# Patient Record
Sex: Male | Born: 1960 | Race: White | Hispanic: No | Smoking: Never smoker
Health system: Southern US, Community
[De-identification: ages and names within clinical notes are randomized; demographics above are authoritative.]

## PROBLEM LIST (undated history)

## (undated) DIAGNOSIS — M1990 Unspecified osteoarthritis, unspecified site: Secondary | ICD-10-CM

## (undated) DIAGNOSIS — E119 Type 2 diabetes mellitus without complications: Secondary | ICD-10-CM

## (undated) HISTORY — PX: OTHER SURGICAL HISTORY: SHX169

## (undated) HISTORY — PX: ANKLE SURGERY: SHX546

## (undated) HISTORY — PX: TONSILLECTOMY: SUR1361

---
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
## 2013-09-15 ENCOUNTER — Encounter (INDEPENDENT_AMBULATORY_CARE_PROVIDER_SITE_OTHER): Payer: Medicare Other | Admitting: Ophthalmology

## 2013-09-15 DIAGNOSIS — H2510 Age-related nuclear cataract, unspecified eye: Secondary | ICD-10-CM

## 2013-09-15 DIAGNOSIS — H43819 Vitreous degeneration, unspecified eye: Secondary | ICD-10-CM

## 2016-02-04 ENCOUNTER — Encounter (INDEPENDENT_AMBULATORY_CARE_PROVIDER_SITE_OTHER): Payer: Medicare Other | Admitting: Ophthalmology

## 2016-02-04 DIAGNOSIS — T8522XA Displacement of intraocular lens, initial encounter: Secondary | ICD-10-CM | POA: Diagnosis not present

## 2016-02-04 DIAGNOSIS — H338 Other retinal detachments: Secondary | ICD-10-CM

## 2016-02-04 DIAGNOSIS — H43813 Vitreous degeneration, bilateral: Secondary | ICD-10-CM

## 2016-02-04 NOTE — H&P (Signed)
Drew EchevariaDouglas Hofbauer Jr. is an 55 y.o. male.   Chief Complaint:loss of vision for one week right eye HPI: Had cataract surgery right eye two years ago.  Also has dislocated IOL and an open posterior capsule.  Now loss of vision right eye  No past medical history on file.  No past surgical history on file.  No family history on file. Social History:  has no tobacco, alcohol, and drug history on file.  Allergies: No Known Allergies  No prescriptions prior to admission.    Review of systems otherwise negative  There were no vitals taken for this visit.  Physical exam: Mental status: oriented x3. Eyes: See eye exam associated with this date of surgery in media tab.  Scanned in by scanning center Ears, Nose, Throat: within normal limits Neck: Within Normal limits General: within normal limits Chest: Within normal limits Breast: deferred Heart: Within normal limits Abdomen: Within normal limits GU: deferred Extremities: within normal limits Skin: within normal limits  Assessment/Plan Rhegmatogenous retinal detachment right eye [facility], gas injection, possible removal of IOL, laser.right eye  Sherrie GeorgeMATTHEWS, Drew D 02/04/2016, 3:16 PM

## 2016-02-07 NOTE — Progress Notes (Signed)
Patient stated that he had been giving arrival time and medication instructions by Dr. Ashley RoyaltyMatthews. Verified that patient knew where to come to. Patient verbalized understanding will review history tomorrow.

## 2016-02-08 ENCOUNTER — Ambulatory Visit (HOSPITAL_COMMUNITY)
Admission: RE | Admit: 2016-02-08 | Discharge: 2016-02-09 | Disposition: A | Discharge status: Home / Self Care | Payer: Medicare Other | Source: Ambulatory Visit | Attending: Ophthalmology | Admitting: Ophthalmology

## 2016-02-08 ENCOUNTER — Ambulatory Visit (HOSPITAL_COMMUNITY): Payer: Medicare Other | Admitting: Anesthesiology

## 2016-02-08 ENCOUNTER — Encounter (INDEPENDENT_AMBULATORY_CARE_PROVIDER_SITE_OTHER): Payer: Self-pay | Admitting: Ophthalmology

## 2016-02-08 ENCOUNTER — Encounter (HOSPITAL_COMMUNITY): Payer: Self-pay

## 2016-02-08 ENCOUNTER — Encounter (HOSPITAL_COMMUNITY)
Admission: RE | Disposition: A | Discharge status: Home / Self Care | Payer: Self-pay | Source: Ambulatory Visit | Attending: Ophthalmology

## 2016-02-08 DIAGNOSIS — E1110 Type 2 diabetes mellitus with ketoacidosis without coma: Secondary | ICD-10-CM

## 2016-02-08 DIAGNOSIS — M1990 Unspecified osteoarthritis, unspecified site: Secondary | ICD-10-CM | POA: Insufficient documentation

## 2016-02-08 DIAGNOSIS — Z6831 Body mass index (BMI) 31.0-31.9, adult: Secondary | ICD-10-CM | POA: Diagnosis not present

## 2016-02-08 DIAGNOSIS — E119 Type 2 diabetes mellitus without complications: Secondary | ICD-10-CM | POA: Diagnosis not present

## 2016-02-08 DIAGNOSIS — H33001 Unspecified retinal detachment with retinal break, right eye: Secondary | ICD-10-CM | POA: Diagnosis not present

## 2016-02-08 DIAGNOSIS — H04129 Dry eye syndrome of unspecified lacrimal gland: Secondary | ICD-10-CM | POA: Insufficient documentation

## 2016-02-08 DIAGNOSIS — Z7984 Long term (current) use of oral hypoglycemic drugs: Secondary | ICD-10-CM | POA: Insufficient documentation

## 2016-02-08 DIAGNOSIS — H338 Other retinal detachments: Secondary | ICD-10-CM | POA: Diagnosis not present

## 2016-02-08 DIAGNOSIS — H33011 Retinal detachment with single break, right eye: Secondary | ICD-10-CM | POA: Insufficient documentation

## 2016-02-08 DIAGNOSIS — I10 Essential (primary) hypertension: Secondary | ICD-10-CM | POA: Insufficient documentation

## 2016-02-08 DIAGNOSIS — E669 Obesity, unspecified: Secondary | ICD-10-CM

## 2016-02-08 HISTORY — PX: SCLERAL BUCKLE: SHX5340

## 2016-02-08 HISTORY — DX: Unspecified osteoarthritis, unspecified site: M19.90

## 2016-02-08 HISTORY — PX: LASER PHOTO ABLATION: SHX5942

## 2016-02-08 LAB — CBC
HEMATOCRIT: 46.4 % (ref 39.0–52.0)
Hemoglobin: 16.7 g/dL (ref 13.0–17.0)
MCH: 29.2 pg (ref 26.0–34.0)
MCHC: 36 g/dL (ref 30.0–36.0)
MCV: 81.3 fL (ref 78.0–100.0)
PLATELETS: 214 10*3/uL (ref 150–400)
RBC: 5.71 MIL/uL (ref 4.22–5.81)
RDW: 12.6 % (ref 11.5–15.5)
WBC: 8.6 10*3/uL (ref 4.0–10.5)

## 2016-02-08 LAB — BASIC METABOLIC PANEL
Anion gap: 10 (ref 5–15)
BUN: 9 mg/dL (ref 6–20)
CHLORIDE: 104 mmol/L (ref 101–111)
CO2: 23 mmol/L (ref 22–32)
Calcium: 9.7 mg/dL (ref 8.9–10.3)
Creatinine, Ser: 0.79 mg/dL (ref 0.61–1.24)
GFR calc non Af Amer: >60 mL/min (ref >60)
Glucose, Bld: 302 mg/dL — ABNORMAL HIGH (ref 65–99)
POTASSIUM: 3.9 mmol/L (ref 3.5–5.1)
SODIUM: 137 mmol/L (ref 135–145)

## 2016-02-08 LAB — GLUCOSE, CAPILLARY
GLUCOSE-CAPILLARY: 316 mg/dL — AB (ref 65–99)
GLUCOSE-CAPILLARY: 304 mg/dL — AB (ref 65–99)
Glucose-Capillary: 261 mg/dL — ABNORMAL HIGH (ref 65–99)
Glucose-Capillary: 249 mg/dL — ABNORMAL HIGH (ref 65–99)

## 2016-02-08 SURGERY — SCLERAL BUCKLE
Anesthesia: General | Site: Eye | Laterality: Right

## 2016-02-08 MED ORDER — ACETAMINOPHEN 325 MG PO TABS: 325.0000 mg | ORAL_TABLET | ORAL | Status: DC | PRN

## 2016-02-08 MED ORDER — ATROPINE SULFATE 1 % OP SOLN
OPHTHALMIC | Status: AC
  Filled 2016-02-08: qty 5

## 2016-02-08 MED ORDER — HYPROMELLOSE (GONIOSCOPIC) 2.5 % OP SOLN
OPHTHALMIC | Status: AC
  Filled 2016-02-08: qty 15

## 2016-02-08 MED ORDER — LIDOCAINE HCL (CARDIAC) 20 MG/ML IV SOLN
INTRAVENOUS | Status: DC | PRN
  Administered 2016-02-08: 40 mg via INTRAVENOUS

## 2016-02-08 MED ORDER — DIPHENHYDRAMINE HCL 25 MG PO TABS
25.0000 mg | ORAL_TABLET | Freq: Four times a day (QID) | ORAL | Status: DC | PRN
  Filled 2016-02-08: qty 1

## 2016-02-08 MED ORDER — POLYMYXIN B SULFATE 500000 UNITS IJ SOLR
INTRAMUSCULAR | Status: AC
  Filled 2016-02-08: qty 500000

## 2016-02-08 MED ORDER — SODIUM CHLORIDE 0.45 % IV SOLN
INTRAVENOUS | Status: DC
  Administered 2016-02-08: 21:00:00 via INTRAVENOUS

## 2016-02-08 MED ORDER — BACITRACIN-POLYMYXIN B 500-10000 UNIT/GM OP OINT
TOPICAL_OINTMENT | OPHTHALMIC | Status: AC
  Filled 2016-02-08: qty 3.5

## 2016-02-08 MED ORDER — PROPOFOL 10 MG/ML IV BOLUS
INTRAVENOUS | Status: DC | PRN
  Administered 2016-02-08: 200 mg via INTRAVENOUS

## 2016-02-08 MED ORDER — FENTANYL CITRATE (PF) 100 MCG/2ML IJ SOLN
INTRAMUSCULAR | Status: DC | PRN
  Administered 2016-02-08 (×2): 50 ug via INTRAVENOUS
  Administered 2016-02-08: 100 ug via INTRAVENOUS
  Administered 2016-02-08 (×4): 50 ug via INTRAVENOUS

## 2016-02-08 MED ORDER — BRIMONIDINE TARTRATE 0.15 % OP SOLN
1.0000 [drp] | Freq: Two times a day (BID) | OPHTHALMIC | Status: DC
  Administered 2016-02-08: 1 [drp] via OPHTHALMIC
  Filled 2016-02-08: qty 5

## 2016-02-08 MED ORDER — LIDOCAINE 2% (20 MG/ML) 5 ML SYRINGE
INTRAMUSCULAR | Status: AC
  Filled 2016-02-08: qty 5

## 2016-02-08 MED ORDER — FENTANYL CITRATE (PF) 100 MCG/2ML IJ SOLN
INTRAMUSCULAR | Status: AC
  Filled 2016-02-08: qty 2

## 2016-02-08 MED ORDER — HYDRALAZINE HCL 20 MG/ML IJ SOLN: 10.0000 mg | INTRAMUSCULAR | Status: DC | PRN

## 2016-02-08 MED ORDER — INSULIN ASPART 100 UNIT/ML ~~LOC~~ SOLN
5.0000 [IU] | Freq: Once | SUBCUTANEOUS | Status: AC
  Administered 2016-02-08: 5 [IU] via SUBCUTANEOUS

## 2016-02-08 MED ORDER — SODIUM HYALURONATE 10 MG/ML IO SOLN
INTRAOCULAR | Status: AC
  Filled 2016-02-08: qty 0.85

## 2016-02-08 MED ORDER — LIDOCAINE HCL 2 % IJ SOLN
INTRAMUSCULAR | Status: AC
  Filled 2016-02-08: qty 20

## 2016-02-08 MED ORDER — STERILE WATER FOR INJECTION IJ SOLN
INTRAMUSCULAR | Status: AC
  Filled 2016-02-08: qty 20

## 2016-02-08 MED ORDER — BRIMONIDINE TARTRATE 0.2 % OP SOLN: 1.0000 [drp] | Freq: Two times a day (BID) | OPHTHALMIC | Status: DC

## 2016-02-08 MED ORDER — GATIFLOXACIN 0.5 % OP SOLN
1.0000 [drp] | Freq: Four times a day (QID) | OPHTHALMIC | Status: DC
  Filled 2016-02-08: qty 2.5

## 2016-02-08 MED ORDER — FENTANYL CITRATE (PF) 100 MCG/2ML IJ SOLN
25.0000 ug | INTRAMUSCULAR | Status: DC | PRN
  Administered 2016-02-08 (×2): 25 ug via INTRAVENOUS

## 2016-02-08 MED ORDER — TROPICAMIDE 1 % OP SOLN
1.0000 [drp] | OPHTHALMIC | Status: AC | PRN
  Administered 2016-02-08 (×2): 1 [drp] via OPHTHALMIC
  Filled 2016-02-08: qty 15

## 2016-02-08 MED ORDER — INSULIN ASPART 100 UNIT/ML ~~LOC~~ SOLN
SUBCUTANEOUS | Status: AC
  Administered 2016-02-08: 5 [IU] via SUBCUTANEOUS
  Filled 2016-02-08: qty 1

## 2016-02-08 MED ORDER — MEPERIDINE HCL 25 MG/ML IJ SOLN: 6.2500 mg | INTRAMUSCULAR | Status: DC | PRN

## 2016-02-08 MED ORDER — EPINEPHRINE PF 1 MG/ML IJ SOLN
INTRAMUSCULAR | Status: AC
  Filled 2016-02-08: qty 1

## 2016-02-08 MED ORDER — ROCURONIUM BROMIDE 10 MG/ML (PF) SYRINGE
PREFILLED_SYRINGE | INTRAVENOUS | Status: AC
  Filled 2016-02-08: qty 10

## 2016-02-08 MED ORDER — ESMOLOL HCL 100 MG/10ML IV SOLN
INTRAVENOUS | Status: AC
  Filled 2016-02-08: qty 10

## 2016-02-08 MED ORDER — DEXAMETHASONE SODIUM PHOSPHATE 10 MG/ML IJ SOLN
INTRAMUSCULAR | Status: AC
  Filled 2016-02-08: qty 1

## 2016-02-08 MED ORDER — BACITRACIN-POLYMYXIN B 500-10000 UNIT/GM OP OINT
1.0000 "application " | TOPICAL_OINTMENT | Freq: Three times a day (TID) | OPHTHALMIC | Status: DC
  Filled 2016-02-08: qty 3.5

## 2016-02-08 MED ORDER — PREDNISOLONE ACETATE 1 % OP SUSP
1.0000 [drp] | Freq: Four times a day (QID) | OPHTHALMIC | Status: DC
  Filled 2016-02-08: qty 5

## 2016-02-08 MED ORDER — SODIUM CHLORIDE 0.9 % IJ SOLN
INTRAMUSCULAR | Status: AC
  Filled 2016-02-08: qty 10

## 2016-02-08 MED ORDER — DORZOLAMIDE HCL 2 % OP SOLN
1.0000 [drp] | Freq: Three times a day (TID) | OPHTHALMIC | Status: DC
  Filled 2016-02-08: qty 10

## 2016-02-08 MED ORDER — BSS IO SOLN
INTRAOCULAR | Status: AC
  Filled 2016-02-08: qty 15

## 2016-02-08 MED ORDER — BSS PLUS IO SOLN
INTRAOCULAR | Status: AC
  Filled 2016-02-08: qty 500

## 2016-02-08 MED ORDER — MIDAZOLAM HCL 2 MG/2ML IJ SOLN
INTRAMUSCULAR | Status: AC
  Filled 2016-02-08: qty 2

## 2016-02-08 MED ORDER — TETRACAINE HCL 0.5 % OP SOLN
2.0000 [drp] | Freq: Once | OPHTHALMIC | Status: DC
  Filled 2016-02-08: qty 2

## 2016-02-08 MED ORDER — HYDROCODONE-ACETAMINOPHEN 5-325 MG PO TABS
1.0000 | ORAL_TABLET | ORAL | Status: DC | PRN
  Administered 2016-02-09: 1 via ORAL
  Filled 2016-02-08: qty 1
  Filled 2016-02-08: qty 2

## 2016-02-08 MED ORDER — IBUPROFEN 600 MG PO TABS: 600.0000 mg | ORAL_TABLET | Freq: Four times a day (QID) | ORAL | Status: DC | PRN

## 2016-02-08 MED ORDER — ESMOLOL HCL 100 MG/10ML IV SOLN
INTRAVENOUS | Status: DC | PRN
  Administered 2016-02-08: 30 mg via INTRAVENOUS
  Administered 2016-02-08: 50 mg via INTRAVENOUS

## 2016-02-08 MED ORDER — ROCURONIUM BROMIDE 100 MG/10ML IV SOLN
INTRAVENOUS | Status: DC | PRN
  Administered 2016-02-08: 50 mg via INTRAVENOUS

## 2016-02-08 MED ORDER — BUPIVACAINE HCL (PF) 0.75 % IJ SOLN
INTRAMUSCULAR | Status: DC | PRN
  Administered 2016-02-08: 30 mL

## 2016-02-08 MED ORDER — ONDANSETRON HCL 4 MG/2ML IJ SOLN: 4.0000 mg | Freq: Four times a day (QID) | INTRAMUSCULAR | Status: DC | PRN

## 2016-02-08 MED ORDER — MIDAZOLAM HCL 2 MG/2ML IJ SOLN: 0.5000 mg | Freq: Once | INTRAMUSCULAR | Status: DC | PRN

## 2016-02-08 MED ORDER — SUGAMMADEX SODIUM 200 MG/2ML IV SOLN
INTRAVENOUS | Status: DC | PRN
  Administered 2016-02-08: 200 mg via INTRAVENOUS

## 2016-02-08 MED ORDER — BACITRACIN-POLYMYXIN B 500-10000 UNIT/GM OP OINT
TOPICAL_OINTMENT | OPHTHALMIC | Status: DC | PRN
  Administered 2016-02-08: 1 via OPHTHALMIC

## 2016-02-08 MED ORDER — INSULIN ASPART 100 UNIT/ML ~~LOC~~ SOLN
0.0000 [IU] | Freq: Three times a day (TID) | SUBCUTANEOUS | Status: DC
  Administered 2016-02-09: 3 [IU] via SUBCUTANEOUS

## 2016-02-08 MED ORDER — LATANOPROST 0.005 % OP SOLN
1.0000 [drp] | Freq: Every day | OPHTHALMIC | Status: DC
  Filled 2016-02-08: qty 2.5

## 2016-02-08 MED ORDER — EPHEDRINE 5 MG/ML INJ
INTRAVENOUS | Status: AC
  Filled 2016-02-08: qty 10

## 2016-02-08 MED ORDER — ACETAZOLAMIDE SODIUM 500 MG IJ SOLR
500.0000 mg | Freq: Once | INTRAMUSCULAR | Status: AC
  Administered 2016-02-09: 500 mg via INTRAVENOUS
  Filled 2016-02-08: qty 500

## 2016-02-08 MED ORDER — LISINOPRIL 5 MG PO TABS
5.0000 mg | ORAL_TABLET | Freq: Every day | ORAL | Status: DC
  Administered 2016-02-08 – 2016-02-09 (×2): 5 mg via ORAL
  Filled 2016-02-08 (×2): qty 1

## 2016-02-08 MED ORDER — ONDANSETRON HCL 4 MG/2ML IJ SOLN
INTRAMUSCULAR | Status: AC
  Filled 2016-02-08: qty 10

## 2016-02-08 MED ORDER — CEFTAZIDIME 1 G IJ SOLR
INTRAMUSCULAR | Status: AC
  Filled 2016-02-08: qty 1

## 2016-02-08 MED ORDER — INSULIN ASPART 100 UNIT/ML ~~LOC~~ SOLN
0.0000 [IU] | Freq: Every day | SUBCUTANEOUS | Status: DC
  Administered 2016-02-08: 4 [IU] via SUBCUTANEOUS

## 2016-02-08 MED ORDER — STERILE WATER FOR INJECTION IJ SOLN
INTRAMUSCULAR | Status: DC | PRN
  Administered 2016-02-08: 20 mL

## 2016-02-08 MED ORDER — DEXAMETHASONE SODIUM PHOSPHATE 10 MG/ML IJ SOLN
INTRAMUSCULAR | Status: DC | PRN
  Administered 2016-02-08: 10 mg via INTRAVENOUS

## 2016-02-08 MED ORDER — PHENYLEPHRINE HCL 2.5 % OP SOLN
1.0000 [drp] | OPHTHALMIC | Status: AC | PRN
Start: 2016-02-08 — End: 2016-02-08
  Administered 2016-02-08 (×2): 1 [drp] via OPHTHALMIC
  Filled 2016-02-08: qty 2

## 2016-02-08 MED ORDER — CEFAZOLIN SODIUM-DEXTROSE 2-4 GM/100ML-% IV SOLN
2.0000 g | INTRAVENOUS | Status: AC
  Administered 2016-02-08: 2 g via INTRAVENOUS
  Filled 2016-02-08: qty 100

## 2016-02-08 MED ORDER — BUPIVACAINE HCL (PF) 0.75 % IJ SOLN
INTRAMUSCULAR | Status: AC
  Filled 2016-02-08: qty 10

## 2016-02-08 MED ORDER — ARTIFICIAL TEARS OP OINT
TOPICAL_OINTMENT | Freq: Every day | OPHTHALMIC | Status: DC | PRN
  Filled 2016-02-08: qty 3.5

## 2016-02-08 MED ORDER — ONDANSETRON HCL 4 MG/2ML IJ SOLN
INTRAMUSCULAR | Status: DC | PRN
  Administered 2016-02-08: 4 mg via INTRAVENOUS

## 2016-02-08 MED ORDER — GATIFLOXACIN 0.5 % OP SOLN
1.0000 [drp] | OPHTHALMIC | Status: AC | PRN
Start: 2016-02-08 — End: 2016-02-08
  Administered 2016-02-08 (×2): 1 [drp] via OPHTHALMIC
  Filled 2016-02-08: qty 2.5

## 2016-02-08 MED ORDER — ACETAZOLAMIDE SODIUM 500 MG IJ SOLR
INTRAMUSCULAR | Status: AC
  Filled 2016-02-08: qty 500

## 2016-02-08 MED ORDER — MIDAZOLAM HCL 5 MG/5ML IJ SOLN
INTRAMUSCULAR | Status: DC | PRN
  Administered 2016-02-08: 2 mg via INTRAVENOUS

## 2016-02-08 MED ORDER — CYCLOPENTOLATE HCL 1 % OP SOLN
1.0000 [drp] | OPHTHALMIC | Status: AC | PRN
  Administered 2016-02-08 (×2): 1 [drp] via OPHTHALMIC
  Filled 2016-02-08: qty 2

## 2016-02-08 MED ORDER — SODIUM CHLORIDE 0.9 % IV SOLN
INTRAVENOUS | Status: DC
  Administered 2016-02-08 (×2): via INTRAVENOUS

## 2016-02-08 MED ORDER — METOPROLOL TARTRATE 5 MG/5ML IV SOLN
INTRAVENOUS | Status: DC | PRN
  Administered 2016-02-08: 2 mg via INTRAVENOUS
  Administered 2016-02-08: 1 mg via INTRAVENOUS
  Administered 2016-02-08: 2 mg via INTRAVENOUS

## 2016-02-08 MED ORDER — TEMAZEPAM 15 MG PO CAPS: 15.0000 mg | ORAL_CAPSULE | Freq: Every evening | ORAL | Status: DC | PRN

## 2016-02-08 MED ORDER — MAGNESIUM HYDROXIDE 400 MG/5ML PO SUSP: 15.0000 mL | Freq: Four times a day (QID) | ORAL | Status: DC | PRN

## 2016-02-08 MED ORDER — DIPHENHYDRAMINE HCL 25 MG PO CAPS: 25.0000 mg | ORAL_CAPSULE | Freq: Four times a day (QID) | ORAL | Status: DC | PRN

## 2016-02-08 MED ORDER — BRIMONIDINE TARTRATE 0.15 % OP SOLN: 1.0000 [drp] | Freq: Two times a day (BID) | OPHTHALMIC | Status: DC

## 2016-02-08 MED ORDER — TRIAMCINOLONE ACETONIDE 40 MG/ML IJ SUSP
INTRAMUSCULAR | Status: AC
  Filled 2016-02-08: qty 5

## 2016-02-08 MED ORDER — PROMETHAZINE HCL 25 MG/ML IJ SOLN: 6.2500 mg | INTRAMUSCULAR | Status: DC | PRN

## 2016-02-08 MED ORDER — PROPOFOL 10 MG/ML IV BOLUS
INTRAVENOUS | Status: AC
  Filled 2016-02-08: qty 20

## 2016-02-08 MED ORDER — LIDOCAINE 2% (20 MG/ML) 5 ML SYRINGE
INTRAMUSCULAR | Status: AC
Start: 2016-02-08 — End: 2016-02-08
  Filled 2016-02-08: qty 10

## 2016-02-08 MED ORDER — MORPHINE SULFATE (PF) 2 MG/ML IV SOLN
1.0000 mg | INTRAVENOUS | Status: AC | PRN
  Administered 2016-02-08: 4 mg via INTRAVENOUS
  Administered 2016-02-09: 2 mg via INTRAVENOUS
  Filled 2016-02-08: qty 1
  Filled 2016-02-08: qty 2

## 2016-02-08 SURGICAL SUPPLY — 70 items
APPLICATOR DR MATTHEWS STRL (MISCELLANEOUS) ×18 IMPLANT
BLADE EYE CATARACT 19 1.4 BEAV (BLADE) ×3 IMPLANT
BLADE MVR KNIFE 19G (BLADE) IMPLANT
CANNULA ANT CHAM MAIN (OPHTHALMIC RELATED) IMPLANT
CANNULA DUAL BORE 23G (CANNULA) IMPLANT
CORDS BIPOLAR (ELECTRODE) IMPLANT
COTTONBALL LRG STERILE PKG (GAUZE/BANDAGES/DRESSINGS) ×9 IMPLANT
COVER MAYO STAND STRL (DRAPES) IMPLANT
COVER SURGICAL LIGHT HANDLE (MISCELLANEOUS) ×3 IMPLANT
DRAPE INCISE 51X51 W/FILM STRL (DRAPES) ×3 IMPLANT
DRAPE OPHTHALMIC 77X100 STRL (CUSTOM PROCEDURE TRAY) ×3 IMPLANT
ERASER HMR WETFIELD 23G BP (MISCELLANEOUS) IMPLANT
FILTER BLUE MILLIPORE (MISCELLANEOUS) IMPLANT
FILTER STRAW FLUID ASPIR (MISCELLANEOUS) IMPLANT
FORCEPS GRIESHABER ILM 25G A (INSTRUMENTS) IMPLANT
GAS AUTO FILL CONSTEL (OPHTHALMIC)
GAS AUTO FILL CONSTELLATION (OPHTHALMIC) IMPLANT
GAS OPHTHALMIC (MISCELLANEOUS) ×3 IMPLANT
GLOVE SS BIOGEL STRL SZ 6.5 (GLOVE) ×1 IMPLANT
GLOVE SS BIOGEL STRL SZ 7 (GLOVE) ×1 IMPLANT
GLOVE SUPERSENSE BIOGEL SZ 6.5 (GLOVE) ×2
GLOVE SUPERSENSE BIOGEL SZ 7 (GLOVE) ×2
GLOVE SURG 8.5 LATEX PF (GLOVE) ×3 IMPLANT
GOWN STRL REUS W/ TWL LRG LVL3 (GOWN DISPOSABLE) ×2 IMPLANT
GOWN STRL REUS W/TWL LRG LVL3 (GOWN DISPOSABLE) ×4
HANDLE PNEUMATIC FOR CONSTEL (OPHTHALMIC) IMPLANT
IMPL SILICONE (Ophthalmic Related) ×1 IMPLANT
IMPLANT SILICONE (Ophthalmic Related) ×5 IMPLANT
KIT BASIN OR (CUSTOM PROCEDURE TRAY) ×3 IMPLANT
KIT PERFLUORON PROCEDURE 5ML (MISCELLANEOUS) IMPLANT
KIT ROOM TURNOVER OR (KITS) ×3 IMPLANT
KNIFE CRESCENT 1.75 EDGEAHEAD (BLADE) IMPLANT
KNIFE GRIESHABER SHARP 2.5MM (MISCELLANEOUS) ×15 IMPLANT
MICROPICK 25G (MISCELLANEOUS)
NEEDLE 18GX1X1/2 (RX/OR ONLY) (NEEDLE) ×3 IMPLANT
NEEDLE 25GX 5/8IN NON SAFETY (NEEDLE) IMPLANT
NEEDLE HYPO 30X.5 LL (NEEDLE) IMPLANT
NEEDLE PRECISIONGLIDE 27X1.5 (NEEDLE) IMPLANT
NS IRRIG 1000ML POUR BTL (IV SOLUTION) ×3 IMPLANT
PACK VITRECTOMY CUSTOM (CUSTOM PROCEDURE TRAY) ×3 IMPLANT
PAD ARMBOARD 7.5X6 YLW CONV (MISCELLANEOUS) ×6 IMPLANT
PAK PIK VITRECTOMY CVS 25GA (OPHTHALMIC) IMPLANT
PIC ILLUMINATED 25G (OPHTHALMIC)
PICK MICROPICK 25G (MISCELLANEOUS) IMPLANT
PIK ILLUMINATED 25G (OPHTHALMIC) IMPLANT
PROBE LASER ILLUM FLEX CVD 25G (OPHTHALMIC) IMPLANT
REPL STRA BRUSH NEEDLE (NEEDLE) IMPLANT
RESERVOIR BACK FLUSH (MISCELLANEOUS) IMPLANT
ROLLS DENTAL (MISCELLANEOUS) ×6 IMPLANT
SLEEVE SCLERAL TYPE 270 (Ophthalmic Related) ×3 IMPLANT
SPEAR EYE SURG WECK-CEL (MISCELLANEOUS) ×12 IMPLANT
SPONGE SURGIFOAM ABS GEL 12-7 (HEMOSTASIS) IMPLANT
STOPCOCK 4 WAY LG BORE MALE ST (IV SETS) IMPLANT
SUT CHROMIC 7 0 TG140 8 (SUTURE) ×3 IMPLANT
SUT ETHILON 9 0 TG140 8 (SUTURE) IMPLANT
SUT MERSILENE 4 0 RV 2 (SUTURE) ×9 IMPLANT
SUT SILK 2 0 (SUTURE) ×2
SUT SILK 2-0 18XBRD TIE 12 (SUTURE) ×1 IMPLANT
SUT SILK 4 0 RB 1 (SUTURE) ×3 IMPLANT
SYR 20CC LL (SYRINGE) IMPLANT
SYR 50ML LL SCALE MARK (SYRINGE) IMPLANT
SYR 5ML LL (SYRINGE) IMPLANT
SYR BULB 3OZ (MISCELLANEOUS) ×3 IMPLANT
SYR TB 1ML LUER SLIP (SYRINGE) IMPLANT
SYRINGE 10CC LL (SYRINGE) IMPLANT
TIRE RTNL 2.5XGRV CNCV 9X (Ophthalmic Related) ×1 IMPLANT
TOWEL OR 17X24 6PK STRL BLUE (TOWEL DISPOSABLE) ×3 IMPLANT
TUBING HIGH PRESS EXTEN 6IN (TUBING) IMPLANT
WATER STERILE IRR 1000ML POUR (IV SOLUTION) ×3 IMPLANT
WIPE INSTRUMENT VISIWIPE 73X73 (MISCELLANEOUS) ×3 IMPLANT

## 2016-02-08 NOTE — Transfer of Care (Signed)
Immediate Anesthesia Transfer of Care Note  Patient: Drew EchevariaDouglas Brazzle Jr.  Procedure(s) Performed: Procedure(s): SCLERAL BUCKLE, C3F8 INJECTION (Right) LASER PHOTO ABLATION (Right)  Patient Location: PACU  Anesthesia Type:General  Level of Consciousness: lethargic and responds to stimulation  Airway & Oxygen Therapy: Patient Spontanous Breathing and Patient connected to nasal cannula oxygen  Post-op Assessment: Report given to RN and Post -op Vital signs reviewed and stable  Post vital signs: Reviewed and stable  Last Vitals:  Vitals:   02/08/16 1121 02/08/16 1200  BP:  131/86  Pulse: 79 89  Resp:  20  Temp: 36.8 C 37 C    Last Pain:  Vitals:   02/08/16 1200  TempSrc: Oral      Patients Stated Pain Goal: 0 (02/08/16 1136)  Complications: No apparent anesthesia complications

## 2016-02-08 NOTE — Anesthesia Preprocedure Evaluation (Addendum)
Anesthesia Evaluation  Patient identified by MRN, date of birth, ID band Patient awake    Reviewed: Allergy & Precautions, NPO status , Patient's Chart, lab work & pertinent test results  History of Anesthesia Complications Negative for: history of anesthetic complications  Airway Mallampati: II  TM Distance: >3 FB Neck ROM: Full    Dental  (+) Dental Advisory Given   Pulmonary neg pulmonary ROS,    breath sounds clear to auscultation       Cardiovascular negative cardio ROS   Rhythm:Regular Rate:Normal     Neuro/Psych negative neurological ROS     GI/Hepatic negative GI ROS,   Endo/Other  Morbid obesityGlucose 302 today: no h/o DM  Renal/GU negative Renal ROS     Musculoskeletal  (+) Arthritis ,   Abdominal (+) + obese,   Peds  Hematology   Anesthesia Other Findings   Reproductive/Obstetrics                            Anesthesia Physical Anesthesia Plan  ASA: III  Anesthesia Plan: General   Post-op Pain Management:    Induction: Intravenous  Airway Management Planned: Oral ETT  Additional Equipment:   Intra-op Plan:   Post-operative Plan: Extubation in OR  Informed Consent: I have reviewed the patients History and Physical, chart, labs and discussed the procedure including the risks, benefits and alternatives for the proposed anesthesia with the patient or authorized representative who has indicated his/her understanding and acceptance.   Dental advisory given  Plan Discussed with: CRNA and Surgeon  Anesthesia Plan Comments: (Plan routine monitors, GETA Dr. Ashley RoyaltyMatthews to consult hospitalist post op for eval of newly elevated blood sugar, given urgent nature of eye requiring surgical intervention, will proceed with surgery)        Anesthesia Quick Evaluation

## 2016-02-08 NOTE — Anesthesia Postprocedure Evaluation (Signed)
Anesthesia Post Note  Patient: Drew EchevariaDouglas Mars Jr.  Procedure(s) Performed: Procedure(s) (LRB): SCLERAL BUCKLE, C3F8 INJECTION (Right) LASER PHOTO ABLATION (Right)  Patient location during evaluation: PACU Anesthesia Type: General Level of consciousness: awake, awake and alert and oriented Pain management: pain level controlled Vital Signs Assessment: post-procedure vital signs reviewed and stable Respiratory status: spontaneous breathing, nonlabored ventilation, respiratory function stable and patient connected to nasal cannula oxygen Cardiovascular status: blood pressure returned to baseline Anesthetic complications: no    Last Vitals:  Vitals:   02/08/16 1200 02/08/16 1802  BP: 131/86   Pulse: 89   Resp: 20   Temp: 37 C 36.5 C    Last Pain:  Vitals:   02/08/16 1802  TempSrc:   PainSc: 0-No pain                 Greenleigh Kauth COKER

## 2016-02-08 NOTE — Progress Notes (Signed)
Anesthesiology Note:  55 year old male with a right retinal detachment who underwent R. scleral buckle and gas injection today under general anesthesia. Pre-op labs were notable for glucose 302. He denies any prior history of diabetes. Surgery and anesthesia were uneventful. Now in PACU.  VS; T-36.5 BP- 161 RR- 18 HR- 110 (SR) O2 Sat 95% on RA  glucose 249.    Impression: Newly diagnosed DM in 55 year old male who underwent R. Scleral buckle today for retinal detachment.  Plan: Hospitalist consult for DM mangement and assistance with follow-up. Admit overnight to 6N for post-op care.  Kipp Broodavid Isael Evans
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
## 2016-02-08 NOTE — Anesthesia Procedure Notes (Signed)
Procedure Name: Intubation Date/Time: 02/08/2016 3:26 PM Performed by: Faustino CongressWHITE, Durell Lofaso TENA Cambrea Kirt Pre-anesthesia Checklist: Patient identified, Emergency Drugs available, Suction available and Patient being monitored Patient Re-evaluated:Patient Re-evaluated prior to inductionOxygen Delivery Method: Circle System Utilized Preoxygenation: Pre-oxygenation with 100% oxygen Intubation Type: IV induction Ventilation: Mask ventilation without difficulty Laryngoscope Size: Mac and 4 Grade View: Grade III Tube type: Oral Tube size: 7.5 mm Number of attempts: 1 Airway Equipment and Method: Stylet and Oral airway Placement Confirmation: ETT inserted through vocal cords under direct vision,  positive ETCO2 and breath sounds checked- equal and bilateral Secured at: 23 cm Tube secured with: Tape Dental Injury: Teeth and Oropharynx as per pre-operative assessment

## 2016-02-08 NOTE — Brief Op Note (Signed)
Brief Operative note   Preoperative diagnosis:  retinal detachment right eye Postoperative diagnosis  * No Diagnosis Codes entered *  Procedures: Scleral buckle, laser, gas injection right eye  Surgeon:  Sherrie GeorgeJohn D Erica Richwine, MD...  Assistant:  Rosalie DoctorLisa Johnson SA  And Salena SanerMindy Techner    Anesthesia: General  Specimen: none  Estimated blood loss:  1cc  Complications: none  Patient sent to PACU in good condition  Composed by Sherrie GeorgeJohn D Theodus Ran MD  Dictation number: [id number]

## 2016-02-09 ENCOUNTER — Encounter (HOSPITAL_COMMUNITY): Payer: Self-pay | Admitting: Ophthalmology

## 2016-02-09 DIAGNOSIS — H33001 Unspecified retinal detachment with retinal break, right eye: Secondary | ICD-10-CM | POA: Diagnosis not present

## 2016-02-09 DIAGNOSIS — H33011 Retinal detachment with single break, right eye: Secondary | ICD-10-CM | POA: Diagnosis not present

## 2016-02-09 DIAGNOSIS — E119 Type 2 diabetes mellitus without complications: Secondary | ICD-10-CM | POA: Diagnosis not present

## 2016-02-09 DIAGNOSIS — I10 Essential (primary) hypertension: Secondary | ICD-10-CM

## 2016-02-09 LAB — LIPID PANEL
CHOL/HDL RATIO: 6.4 ratio
Cholesterol: 244 mg/dL — ABNORMAL HIGH (ref 0–200)
HDL: 38 mg/dL — AB (ref >40)
LDL CALC: 191 mg/dL — AB (ref 0–99)
Triglycerides: 77 mg/dL (ref <150)
VLDL: 15 mg/dL (ref 0–40)

## 2016-02-09 LAB — GLUCOSE, CAPILLARY: Glucose-Capillary: 248 mg/dL — ABNORMAL HIGH (ref 65–99)

## 2016-02-09 MED ORDER — ARTIFICIAL TEARS OP OINT: TOPICAL_OINTMENT | Freq: Every day | OPHTHALMIC | 0 refills | Status: AC | PRN

## 2016-02-09 MED ORDER — BACITRACIN-POLYMYXIN B 500-10000 UNIT/GM OP OINT: 1.0000 "application " | TOPICAL_OINTMENT | Freq: Three times a day (TID) | OPHTHALMIC | 0 refills | Status: DC

## 2016-02-09 MED ORDER — HYDROCODONE-ACETAMINOPHEN 5-325 MG PO TABS: 1.0000 | ORAL_TABLET | ORAL | 0 refills | Status: DC | PRN

## 2016-02-09 MED ORDER — GATIFLOXACIN 0.5 % OP SOLN
1.0000 [drp] | Freq: Four times a day (QID) | OPHTHALMIC | Status: DC
Start: 2016-02-09 — End: 2018-12-11

## 2016-02-09 MED ORDER — LIVING WELL WITH DIABETES BOOK
Freq: Once | Status: AC
  Administered 2016-02-09: 11:00:00
  Filled 2016-02-09: qty 1

## 2016-02-09 MED ORDER — PREDNISOLONE ACETATE 1 % OP SUSP: 1.0000 [drp] | Freq: Four times a day (QID) | OPHTHALMIC | 0 refills | Status: DC

## 2016-02-09 MED ORDER — METFORMIN HCL 500 MG PO TABS: 500.0000 mg | ORAL_TABLET | Freq: Every day | ORAL | 0 refills | Status: DC

## 2016-02-09 NOTE — Progress Notes (Signed)
02/09/2016, 6:31 AM  Mental Status:  Awake, Alert, Oriented  Anterior segment: Cornea  Clear    Anterior Chamber Clear    Lens:   , IOL  Intra Ocular Pressure 15 mmHg with Tonopen  Vitreous: Clear 30%gas bubble   Retina:  Attached Good laser reaction   Impression: Excellent result Retina attached   Final Diagnosis: Active Problems:   Macula-off rhegmatogenous retinal detachment, right   Diabetes mellitus, new onset (HCC)   Obesity   Hypertension   Plan: start post operative eye drops.  Discharge to home.  Give post operative instructions  Sherrie GeorgeMATTHEWS, Drew D 02/09/2016, 6:31 AM

## 2016-02-09 NOTE — Plan of Care (Signed)
Problem: Food- and Nutrition-Related Knowledge Deficit (NB-1.1) Goal: Nutrition education Formal process to instruct or train a patient/client in a skill or to impart knowledge to help patients/clients voluntarily manage or modify food choices and eating behavior to maintain or improve health. Outcome: Adequate for Discharge  RD consulted for nutrition education regarding diabetes.   No results found for: HGBA1C   Spoke with pt and family friend at bedside. Pt reports he was making slight changes to his diet due to suspicion that he was developing DM, including eliminating sugary drinks. He has been more mindful about his eating patterns including late night snacking.   RD provided "Carbohydrate Counting for People with Diabetes" handout from the Academy of [employer]. Discussed different food groups and their effects on blood sugar, emphasizing carbohydrate-containing foods. Provided list of carbohydrates and recommended serving sizes of common foods.  Discussed importance of controlled and consistent carbohydrate intake throughout the day. Provided examples of ways to balance meals/snacks and encouraged intake of high-fiber, whole grain complex carbohydrates. Teach back method used.  Expect fair to good compliance.  Body mass index is 31.19 kg/m. Pt meets criteria for obesity, class I based on current BMI.  Current diet order is carb modified, patient is consuming approximately 100% of meals at this time. Labs and medications reviewed. No further nutrition interventions warranted at this time. RD contact information provided. If additional nutrition issues arise, please re-consult RD.  Sherral Dirocco A. Mayford KnifeWilliams, RD, LDN, CDE Pager: [phone] After hours Pager: [phone]

## 2016-02-09 NOTE — Progress Notes (Signed)
Spoke with patient about the new onset of diabetes.  Patient aware of the symptoms of diabetes. Has an aunt with diabetes, his deceased father also had diabetes. States that he does not eat well, but is very knowledgeable of what he needs to be eating.  [facility] He will need to inform them of his hospitalization and will need to be followed for his diabetes control. Reviewed HgbA1C (no result of yet) and checking blood sugars everyday with glucose meter. Dr. Elisabeth Pigeonevine in to see patient.  Will probably be discharged on oral medication. Consult ordered for dietician. Case manager to see patient, as well.  Living Well with Diabetes booklet ordered from the pharmacy. Will continue to monitor blood sugars while in the hospital. Smith MinceKendra Smokey Melott RN BSN CDE

## 2016-02-09 NOTE — Discharge Summary (Signed)
Physician Discharge Summary  Drew Echevariaouglas Diel Jr. ZOX:[id number] DOB: March 05, 1961 DOA: 02/08/2016  PCP: No primary care provider on file.  Admit date: 02/08/2016 Discharge date: 02/09/2016  Recommendations for Outpatient Follow-up:  1. Metformin on discharge 500 mg a day 2. Follow up with ophthalmology per sch appt   Discharge Diagnoses:  Active Problems:   Macula-off rhegmatogenous retinal detachment, right   Diabetes mellitus, new onset (HCC)   Obesity   Hypertension    Discharge Condition: stable   Diet recommendation: as tolerated   History of present illness:   Per consult note 02/08/16 "55 y.o. male who typically enjoys good health and denies any significant medical history, though he does not follow with a PCP, now presenting for surgical repair of right retinal detachment and noted to have marked hyperglycemia on preoperative laboratory evaluation. History is somewhat limited as the patient is somewhat somnolent in the PACU. He reports a family history of non-insulin-dependent diabetes mellitus in his father and an aunt. He is unsure of any prior lipid panels and denies history of hypertension. He has not noted polydipsia or polyuria, has experienced visual disturbances, but these have been attributed to cataract and now retinal detachment. He does not have any known underlying heart disease or kidney disease and serum creatinine is normal on preoperative testing. EKG was obtained preoperatively and features a nonspecific interventricular conduction delay. The patient has remained normotensive. Patient underwent cataract surgery over a year ago and now has suffered a right retinal detachment which was treated this evening with scleral buckle, laser, and gas injection of the right eye by Dr. Ashley RoyaltyMatthews of ophthalmology. Patient reportedly tolerated the procedure without any immediate complication and was transferred to the PACU in good condition. He is interviewed in the PACU where he  remained somewhat somnolent following anesthesia, but is well appearing and with BP 170/85 range, but otherwise normal vitals. He will be observed in the hospital overnight and hospitalist are consulted for further evaluation and management and marked elevation in serum glucose without prior diagnosis of diabetes."  Hospital Course:   New onset diabetes - Will prescribe metformin 500 mg a day - He will follow up with primary care physician and he will follow-up on A1c level - Diabetic coordinator at the bedside this morning - Patient understanding the need for new medication   Signed:  Manson PasseyEVINE, ALMA, MD  [facility] 02/09/2016, 11:05 AM  Pager #: [phone]  Time spent in minutes: less than 30 minutes    Discharge Exam: Vitals:   02/09/16 0204 02/09/16 0604  BP: 126/68 110/62  Pulse: 79 78  Resp: 18 18  Temp: 97.6 F (36.4 C) 97.5 F (36.4 C)   Vitals:   02/08/16 2003 02/08/16 2019 02/09/16 0204 02/09/16 0604  BP: (!) 153/86 (!) 169/86 126/68 110/62  Pulse: 94 97 79 78  Resp: 18  18 18   Temp:  99 F (37.2 C) 97.6 F (36.4 C) 97.5 F (36.4 C)  TempSrc:  Oral    SpO2: 97% 97% 98% 99%  Weight:      Height:        General: Pt is alert, follows commands appropriately, not in acute distress Cardiovascular: Regular rate and rhythm, S1/S2 + Respiratory: Clear to auscultation bilaterally, no wheezing, no crackles, no rhonchi Abdominal: Soft, non tender, non distended, bowel sounds +, no guarding Extremities: no edema, no cyanosis, pulses palpable bilaterally DP and PT Neuro: Grossly nonfocal  Discharge Instructions  Discharge Instructions    Call MD for:  persistant nausea and vomiting    Complete by:  As directed    Call MD for:  redness, tenderness, or signs of infection (pain, swelling, redness, odor or green/yellow discharge around incision site)    Complete by:  As directed    Call MD for:  severe uncontrolled pain    Complete by:  As directed     Diet - low sodium heart healthy    Complete by:  As directed    Diet - low sodium heart healthy    Complete by:  As directed    Discharge instructions    Complete by:  As directed    Post-operative care        Retina and Vitreous surgery  Care of your eye: Please do not rub your eye, you may damage the eye. Wash your hands and use clean tissues each time you blot the eye You may take Extra Strength Tylenol if needed for discomfort.  Call for an appointment if the pain becomes severe and is not improved with the medications given on discharge from [facility]. It is normal for the eye to be red, swollen and bloodshot.  You will have red-tinged tears.  Activities: *Bed rest or moderate activity is recommended for the first week. *Do not engage in extended reading because this movement can loosen the retina. * Do not strain yourself or lift more than ten pounds.   *You May: Watch television, eat in a restaurant and ride in a car  Bathing:   *Do not get your eye wet for 4 days.  You may bathe without getting the eye wet.  You may wash your hair by lying backward or forward at the sink.  Gas bubble: If you have a gas bubble you may not lie on your back.  You must not fly in an airplane as long as the bubble is in your eye. You will see the bubble as a dark circle in the lower part of your vision.  It moves when you tilt your head.  Maintain the following positions:  Left side down or face down.  May sit straight up.  Post Operative Appointment: Please call my office at [phone] for a one-week follow up appointment if one was not given to you before surgery.  Dressing:  Please wear the hard shield only at night for three weeks.  You may include the pad if needed  MEDICATION INSTRUCTIONS Place the following drops in the operative eye: __Artificial tears 4 times daily ____Zymaxid    4 times daily ____Prednisolone 1%   4 times daily ____Polysporin Ophth Ointment 3 times daily __  *Please  wait 5 minutes between eye drops if they are scheduled at the same time *Please continue all eye drops in the non-operative eye *Resume all of your other medications as before your surgery   Discharge instructions    Complete by:  As directed    Metformin on discharge 500 mg a day Follow up with PCP in next 2-3 weeks, recheck A1c   Increase activity slowly    Complete by:  As directed    Increase activity slowly    Complete by:  As directed        Medication List    TAKE these medications   artificial tears Oint ophthalmic ointment Place into the left eye daily as needed (dry eyes).   ARTIFICIAL TEARS OP Apply 1 drop to eye daily as needed (dry eyes).   bacitracin-polymyxin b ophthalmic ointment Commonly known  as:  POLYSPORIN Place 1 application into the right eye 3 (three) times daily. apply to eye every 12 hours while awake   diphenhydrAMINE 25 MG tablet Commonly known as:  BENADRYL Take 25 mg by mouth every 6 (six) hours as needed for allergies.   gatifloxacin 0.5 % Soln Commonly known as:  ZYMAXID Place 1 drop into the right eye 4 (four) times daily.   HYDROcodone-acetaminophen 5-325 MG tablet Commonly known as:  NORCO/VICODIN Take 1-2 tablets by mouth every 4 (four) hours as needed for moderate pain.   ibuprofen 200 MG tablet Commonly known as:  ADVIL,MOTRIN Take 600 mg by mouth every 6 (six) hours as needed for headache or moderate pain.   metFORMIN 500 MG tablet Commonly known as:  GLUCOPHAGE Take 1 tablet (500 mg total) by mouth daily with breakfast.   prednisoLONE acetate 1 % ophthalmic suspension Commonly known as:  PRED FORTE Place 1 drop into the right eye 4 (four) times daily.      Follow-up Information    [facility] Follow [facility].   Contact information: [address] [place] [phone]            The results of significant diagnostics from this hospitalization (including imaging, microbiology, ancillary and  laboratory) are listed below for reference.    Significant Diagnostic Studies: No results found.  Microbiology: No results found for this or any previous visit (from the past 240 hour(s)).   Labs: Basic Metabolic Panel:  Recent Labs Lab 02/08/16 1142  NA 137  K 3.9  CL 104  CO2 23  GLUCOSE 302*  BUN 9  CREATININE 0.79  CALCIUM 9.7   Liver Function Tests: No results for input(s): AST, ALT, ALKPHOS, BILITOT, PROT, ALBUMIN in the last 168 hours. No results for input(s): LIPASE, AMYLASE in the last 168 hours. No results for input(s): AMMONIA in the last 168 hours. CBC:  Recent Labs Lab 02/08/16 1142  WBC 8.6  HGB 16.7  HCT 46.4  MCV 81.3  PLT 214   Cardiac Enzymes: No results for input(s): CKTOTAL, CKMB, CKMBINDEX, TROPONINI in the last 168 hours. BNP: BNP (last 3 results) No results for input(s): BNP in the last 8760 hours.  ProBNP (last 3 results) No results for input(s): PROBNP in the last 8760 hours.  CBG:  Recent Labs Lab 02/08/16 1432 02/08/16 1806 02/08/16 1919 02/08/16 2138 02/09/16 0829  GLUCAP 261* 249* 316* 304* 248*

## 2016-02-09 NOTE — Care Management Note (Signed)
Case Management Note  Patient Details  Name: Drew EchevariaDouglas Hires Jr. MRN: [id number] Date of Birth: 19-Apr-1960  Subjective/Objective:                    Action/Plan:  Spoke with patient and two male visitors in hospital room. Patient states [facility] [address] . He called a few months ago and was assigned a MD ( patient not sure of name) , but never scheduled appointment. Visitor stated she will make him an appointment today. Expected Discharge Date:                  Expected Discharge Plan:  Home/Self Care  In-House Referral:     Discharge planning Services  CM Consult  Post Acute Care Choice:    Choice offered to:  Patient  DME Arranged:    DME Agency:     HH Arranged:    HH Agency:     Status of Service:  Completed, signed off  If discussed at [employer] Length of Stay Meetings, dates discussed:    Additional Comments:  Drew Evans, Drew Kaufman Marie, RN 02/09/2016, 10:07 AM

## 2016-02-09 NOTE — Progress Notes (Signed)
Patient discharged to home with instructions and prescriptions. 

## 2016-02-09 NOTE — Discharge Instructions (Signed)
Metformin extended-release tablets What is this medicine? METFORMIN (met FOR min) is used to treat type 2 diabetes. It helps to control blood sugar. Treatment is combined with diet and exercise. This medicine can be used alone or with other medicines for diabetes. This medicine may be used for other purposes; ask your health care provider or pharmacist if you have questions. COMMON BRAND NAME(S): Fortamet, Glucophage XR, Glumetza What should I tell my health care provider before I take this medicine? They need to know if you have any of these conditions: -anemia -frequently drink alcohol-containing beverages -become easily dehydrated -heart attack -heart failure -kidney disease -liver disease -polycystic ovary syndrome -serious infection or injury -vomiting -an unusual or allergic reaction to metformin, other medicines, foods, dyes, or preservatives -pregnant or trying to get pregnant -breast-feeding How should I use this medicine? Take this medicine by mouth with a glass of water. Take it with meals. Swallow whole, do not crush or chew. Follow the directions on the prescription label. Take your medicine at regular intervals. Do not take your medicine more often than directed. Talk to your pediatrician regarding the use of this medicine in children. Special care may be needed. Overdosage: If you think you have taken too much of this medicine contact a poison control center or emergency room at once. NOTE: This medicine is only for you. Do not share this medicine with others. What if I miss a dose? If you miss a dose, take it as soon as you can. If it is almost time for your next dose, take only that dose. Do not take double or extra doses. What may interact with this medicine? Do not take this medicine with any of the following medications: -dofetilide -gatifloxacin -certain contrast medicines given before X-rays, CT scans, MRI, or other procedures This medicine may also interact with  the following medications: -acetazolamide -certain medicines for HIV infection or hepatitis, like adefovir, emtricitabine, entecavir, lamivudine, or tenofovir -cimetidine -crizotinib -digoxin -diuretics -male hormones, like estrogens or progestins and birth control pills -glycopyrrolate -isoniazid -lamotrigine -medicines for blood pressure, heart disease, irregular heart beat -memantine -midodrine -methazolamide -morphine -nicotinic acid -phenothiazines like chlorpromazine, mesoridazine, prochlorperazine, thioridazine -phenytoin -procainamide -propantheline -quinidine -quinine -ranitidine -ranolazine -steroid medicines like prednisone or cortisone -stimulant medicines for attention disorders, weight loss, or to stay awake -thyroid medicines -topiramate -trimethoprim -trospium -vancomycin -vandetanib -zonisamide This list may not describe all possible interactions. Give your health care provider a list of all the medicines, herbs, non-prescription drugs, or dietary supplements you use. Also tell them if you smoke, drink alcohol, or use illegal drugs. Some items may interact with your medicine. What should I watch for while using this medicine? Visit your doctor or health care professional for regular checks on your progress. A test called the HbA1C (A1C) will be monitored. This is a simple blood test. It measures your blood sugar control over the last 2 to 3 months. You will receive this test every 3 to 6 months. Learn how to check your blood sugar. Learn the symptoms of low and high blood sugar and how to manage them. Always carry a quick-source of sugar with you in case you have symptoms of low blood sugar. Examples include hard sugar candy or glucose tablets. Make sure others know that you can choke if you eat or drink when you develop serious symptoms of low blood sugar, such as seizures or unconsciousness. They must get medical help at once. Tell your doctor or health  care professional if you have  high blood sugar. You might need to change the dose of your medicine. If you are sick or exercising more than usual, you might need to change the dose of your medicine. Do not skip meals. Ask your doctor or health care professional if you should avoid alcohol. Many nonprescription cough and cold products contain sugar or alcohol. These can affect blood sugar. This medicine may cause ovulation in premenopausal women who do not have regular monthly periods. This may increase your chances of becoming pregnant. You should not take this medicine if you become pregnant or think you may be pregnant. Talk with your doctor or health care professional about your birth control options while taking this medicine. Contact your doctor or health care professional right away if think you are pregnant. The tablet shell for some brands of this medicine does not dissolve. This is normal. The tablet shell may appear whole in the stool. This is not a cause for concern. If you are going to need surgery, a MRI, CT scan, or other procedure, tell your doctor that you are taking this medicine. You may need to stop taking this medicine before the procedure. Wear a medical ID bracelet or chain, and carry a card that describes your disease and details of your medicine and dosage times. What side effects may I notice from receiving this medicine? Side effects that you should report to your doctor or health care professional as soon as possible: -allergic reactions like skin rash, itching or hives, swelling of the face, lips, or tongue -breathing problems -feeling faint or lightheaded, falls -muscle aches or pains -signs and symptoms of low blood sugar such as feeling anxious, confusion, dizziness, increased hunger, unusually weak or tired, sweating, shakiness, cold, irritable, headache, blurred vision, fast heartbeat, loss of consciousness -slow or irregular heartbeat -unusual stomach pain or  discomfort -unusually tired or weak Side effects that usually do not require medical attention (report to your doctor or health care professional if they continue or are bothersome): -diarrhea -headache -heartburn -metallic taste in mouth -nausea -stomach gas, upset This list may not describe all possible side effects. Call your doctor for medical advice about side effects. You may report side effects to FDA at 1-800-FDA-1088. Where should I keep my medicine? Keep out of the reach of children. Store at room temperature between 15 and 30 degrees C (59 and 86 degrees F). Protect from light. Throw away any unused medicine after the expiration date. NOTE: This sheet is a summary. It may not cover all possible information. If you have questions about this medicine, talk to your doctor, pharmacist, or health care provider.  2017 Elsevier/Gold Standard (2013-08-05 22:12:16)

## 2016-02-09 NOTE — Op Note (Signed)
NAMKristine Evans:  Barsanti, Bobbie              ACCOUNT NO.:  [id number]  MEDICAL RECORD NO.:  [id number]  LOCATION:  6N32C                        FACILITY:  [facility]  PHYSICIAN:  Beulah GandyJohn D. Ashley RoyaltyMatthews, M.D. DATE OF BIRTH:  May 20, 1960  DATE OF PROCEDURE:  02/08/2016 DATE OF DISCHARGE:                              OPERATIVE REPORT   ADMISSION DIAGNOSIS:  Retinal detachment, right eye.  PROCEDURE:  Scleral buckle, right eye.  Retinal photocoagulation, right eye.  Gas fluid exchange, right eye.  SURGEON:  Beulah GandyJohn D. Ashley RoyaltyMatthews, M.D.  ASSISTANT:  Rosalie DoctorLisa Johnson and [facility] Tischner.  ANESTHESIA:  General.  DETAILS:  After proper endotracheal anesthesia usual prep and drape was performed.  360 degree limbal peritomy, isolation of 4 rectus muscles on 2-0 silk, scleral dissection for 360 degrees to admit #279 intrascleral implant.  Diathermy placed in the bed.  279 implant was trimmed with 1 mm trim from the posterior edge.  The implant was placed around the globe with the joint at 2 o'clock.  A 240 band was placed around the eye with a 270 sleeve at 2 o'clock.  2 sutures per quadrant and 3 sutures in 1 quadrant, 9 sutures were placed in the scleral flaps.  Perforation site chosen at 10 o'clock in the posterior aspect of the bed.  A large amount of clear colorless subretinal fluid came forth in a controlled manner.  When the fluid stopped the scleral sutures were drawn securely and a proper indentation of the globe was obtained.  The buckle was adjusted and trimmed.  The band was adjusted and trimmed.  The sutures were pulled securely knotted and the free ends removed.  The indirect ophthalmoscope showed the retina to be lying nicely in place on the scleral buckle.  Minimal subretinal fluid remaining.  The indirect ophthalmoscope laser was moved into place. 762 burns were placed around the retinal periphery.  The power between 400 and 900 mW, 1000 microns each and, 0.1 seconds each.  C3F8 100% 0.6 mL was injected  through the 12 o'clock pars plana as a gas bubble.  Paracentesis x2 obtained a closing pressure of 10 with a Barraquer tonometer.  The buckle was adjusted and trimmed.  The sutures were trimmed.  The conjunctiva was reposited with 7-0 chromic suture.  Polymyxin and ceftazidime were injected around the globe for postop antibiotic coverage.  Decadron 10 mg was injected into the lower subconjunctival space.  Atropine solution was applied.  Marcaine was injected around the globe for postop pain, Polysporin ophthalmic ointment, patch, and shield were placed.  Closing pressure was 10 with a Barraquer tonometer.  COMPLICATIONS:  None.  DURATION:  2 hours and 30 minutes.  The patient was awakened and taken to recovery in satisfactory condition.     Beulah GandyJohn D. Ashley RoyaltyMatthews, M.D.     JDM/MEDQ  D:  02/08/2016  T:  02/09/2016  Job:  [id number]

## 2016-02-10 LAB — HEMOGLOBIN A1C
HEMOGLOBIN A1C: 11.4 % — AB (ref 4.8–5.6)
Mean Plasma Glucose: 280 mg/dL

## 2016-02-10 LAB — INSULIN, RANDOM: Insulin: 4.2 u[IU]/mL (ref 2.6–24.9)

## 2016-02-10 LAB — GLUTAMIC ACID DECARBOXYLASE AUTO ABS: Glutamic Acid Decarb Ab: <5 U/mL (ref 0.0–5.0)

## 2016-02-10 LAB — C-PEPTIDE: C PEPTIDE: 1.5 ng/mL (ref 1.1–4.4)

## 2016-02-14 LAB — INSULIN ANTIBODIES, BLOOD: Insulin Antibodies, Human: <5 uU/mL

## 2016-02-15 ENCOUNTER — Inpatient Hospital Stay (INDEPENDENT_AMBULATORY_CARE_PROVIDER_SITE_OTHER): Payer: Medicare Other | Admitting: Ophthalmology

## 2016-02-15 DIAGNOSIS — H338 Other retinal detachments: Secondary | ICD-10-CM

## 2016-03-13 ENCOUNTER — Encounter (INDEPENDENT_AMBULATORY_CARE_PROVIDER_SITE_OTHER): Payer: Medicare Other | Admitting: Ophthalmology

## 2016-03-13 DIAGNOSIS — H338 Other retinal detachments: Secondary | ICD-10-CM

## 2016-03-27 DIAGNOSIS — H33001 Unspecified retinal detachment with retinal break, right eye: Secondary | ICD-10-CM | POA: Diagnosis not present

## 2016-03-27 DIAGNOSIS — H3321 Serous retinal detachment, right eye: Secondary | ICD-10-CM | POA: Diagnosis not present

## 2016-05-11 DIAGNOSIS — H2512 Age-related nuclear cataract, left eye: Secondary | ICD-10-CM | POA: Diagnosis not present

## 2016-05-11 DIAGNOSIS — H04123 Dry eye syndrome of bilateral lacrimal glands: Secondary | ICD-10-CM | POA: Diagnosis not present

## 2016-05-11 DIAGNOSIS — Z961 Presence of intraocular lens: Secondary | ICD-10-CM | POA: Diagnosis not present

## 2016-05-22 ENCOUNTER — Encounter (INDEPENDENT_AMBULATORY_CARE_PROVIDER_SITE_OTHER): Payer: Medicare Other | Admitting: Ophthalmology

## 2016-07-13 DIAGNOSIS — H04123 Dry eye syndrome of bilateral lacrimal glands: Secondary | ICD-10-CM | POA: Diagnosis not present

## 2016-07-13 DIAGNOSIS — H2512 Age-related nuclear cataract, left eye: Secondary | ICD-10-CM | POA: Diagnosis not present

## 2016-07-13 DIAGNOSIS — Z961 Presence of intraocular lens: Secondary | ICD-10-CM | POA: Diagnosis not present

## 2016-09-14 DIAGNOSIS — H2512 Age-related nuclear cataract, left eye: Secondary | ICD-10-CM | POA: Diagnosis not present

## 2016-09-14 DIAGNOSIS — H11442 Conjunctival cysts, left eye: Secondary | ICD-10-CM | POA: Diagnosis not present

## 2016-09-14 DIAGNOSIS — Z961 Presence of intraocular lens: Secondary | ICD-10-CM | POA: Diagnosis not present

## 2016-09-14 DIAGNOSIS — H04123 Dry eye syndrome of bilateral lacrimal glands: Secondary | ICD-10-CM | POA: Diagnosis not present

## 2016-10-13 DIAGNOSIS — H40043 Steroid responder, bilateral: Secondary | ICD-10-CM | POA: Diagnosis not present

## 2018-11-14 ENCOUNTER — Encounter (HOSPITAL_COMMUNITY): Payer: Self-pay

## 2018-11-14 ENCOUNTER — Encounter (HOSPITAL_COMMUNITY)
Admission: EM | Disposition: A | Discharge status: Home Health | Payer: Self-pay | Source: Home / Self Care | Attending: Student in an Organized Health Care Education/Training Program

## 2018-11-14 ENCOUNTER — Other Ambulatory Visit: Payer: Self-pay

## 2018-11-14 ENCOUNTER — Inpatient Hospital Stay (HOSPITAL_COMMUNITY): Payer: Medicare Other | Admitting: Certified Registered Nurse Anesthetist

## 2018-11-14 ENCOUNTER — Inpatient Hospital Stay (HOSPITAL_COMMUNITY)
Admission: EM | Admit: 2018-11-14 | Discharge: 2018-11-24 | DRG: 853 | Disposition: A | Discharge status: Home Health | Payer: Medicare Other | Attending: Internal Medicine | Admitting: Internal Medicine

## 2018-11-14 DIAGNOSIS — M1990 Unspecified osteoarthritis, unspecified site: Secondary | ICD-10-CM | POA: Diagnosis present

## 2018-11-14 DIAGNOSIS — Z87891 Personal history of nicotine dependence: Secondary | ICD-10-CM

## 2018-11-14 DIAGNOSIS — Z833 Family history of diabetes mellitus: Secondary | ICD-10-CM

## 2018-11-14 DIAGNOSIS — A419 Sepsis, unspecified organism: Principal | ICD-10-CM | POA: Diagnosis present

## 2018-11-14 DIAGNOSIS — L02212 Cutaneous abscess of back [any part, except buttock]: Secondary | ICD-10-CM | POA: Diagnosis present

## 2018-11-14 DIAGNOSIS — L8990 Pressure ulcer of unspecified site, unspecified stage: Secondary | ICD-10-CM | POA: Insufficient documentation

## 2018-11-14 DIAGNOSIS — I10 Essential (primary) hypertension: Secondary | ICD-10-CM | POA: Diagnosis present

## 2018-11-14 DIAGNOSIS — Z7984 Long term (current) use of oral hypoglycemic drugs: Secondary | ICD-10-CM

## 2018-11-14 DIAGNOSIS — G9341 Metabolic encephalopathy: Secondary | ICD-10-CM | POA: Diagnosis present

## 2018-11-14 DIAGNOSIS — R066 Hiccough: Secondary | ICD-10-CM | POA: Diagnosis not present

## 2018-11-14 DIAGNOSIS — L03312 Cellulitis of back [any part except buttock]: Secondary | ICD-10-CM | POA: Diagnosis present

## 2018-11-14 DIAGNOSIS — E1110 Type 2 diabetes mellitus with ketoacidosis without coma: Secondary | ICD-10-CM | POA: Diagnosis present

## 2018-11-14 DIAGNOSIS — Z20828 Contact with and (suspected) exposure to other viral communicable diseases: Secondary | ICD-10-CM | POA: Diagnosis present

## 2018-11-14 DIAGNOSIS — Z6832 Body mass index (BMI) 32.0-32.9, adult: Secondary | ICD-10-CM

## 2018-11-14 DIAGNOSIS — K529 Noninfective gastroenteritis and colitis, unspecified: Secondary | ICD-10-CM | POA: Diagnosis present

## 2018-11-14 DIAGNOSIS — E118 Type 2 diabetes mellitus with unspecified complications: Secondary | ICD-10-CM | POA: Diagnosis not present

## 2018-11-14 DIAGNOSIS — E669 Obesity, unspecified: Secondary | ICD-10-CM | POA: Diagnosis present

## 2018-11-14 HISTORY — DX: Type 2 diabetes mellitus without complications: E11.9

## 2018-11-14 HISTORY — PX: IRRIGATION AND DEBRIDEMENT ABSCESS: SHX5252

## 2018-11-14 LAB — BETA-HYDROXYBUTYRIC ACID: Beta-Hydroxybutyric Acid: 0.32 mmol/L — ABNORMAL HIGH (ref 0.05–0.27)

## 2018-11-14 LAB — CBC WITH DIFFERENTIAL/PLATELET
Abs Immature Granulocytes: 0 10*3/uL (ref 0.00–0.07)
Basophils Absolute: 0 10*3/uL (ref 0.0–0.1)
Basophils Relative: 0 %
Eosinophils Absolute: 0.3 10*3/uL (ref 0.0–0.5)
Eosinophils Relative: 1 %
HCT: 42.4 % (ref 39.0–52.0)
Hemoglobin: 14.2 g/dL (ref 13.0–17.0)
Lymphocytes Relative: 4 %
Lymphs Abs: 1.2 10*3/uL (ref 0.7–4.0)
MCH: 27.8 pg (ref 26.0–34.0)
MCHC: 33.5 g/dL (ref 30.0–36.0)
MCV: 83 fL (ref 80.0–100.0)
Monocytes Absolute: 1.2 10*3/uL — ABNORMAL HIGH (ref 0.1–1.0)
Monocytes Relative: 4 %
Neutro Abs: 26.8 10*3/uL — ABNORMAL HIGH (ref 1.7–7.7)
Neutrophils Relative %: 91 %
Platelets: 302 10*3/uL (ref 150–400)
RBC: 5.11 MIL/uL (ref 4.22–5.81)
RDW: 13.1 % (ref 11.5–15.5)
WBC: 29.5 10*3/uL — ABNORMAL HIGH (ref 4.0–10.5)
nRBC: 0 % (ref 0.0–0.2)
nRBC: 0 /100 WBC

## 2018-11-14 LAB — I-STAT CHEM 8, ED
BUN: 24 mg/dL — ABNORMAL HIGH (ref 6–20)
Calcium, Ion: 1.32 mmol/L (ref 1.15–1.40)
Chloride: 95 mmol/L — ABNORMAL LOW (ref 98–111)
Creatinine, Ser: 0.8 mg/dL (ref 0.61–1.24)
Glucose, Bld: 374 mg/dL — ABNORMAL HIGH (ref 70–99)
HCT: 45 % (ref 39.0–52.0)
Hemoglobin: 15.3 g/dL (ref 13.0–17.0)
Potassium: 4.2 mmol/L (ref 3.5–5.1)
Sodium: 127 mmol/L — ABNORMAL LOW (ref 135–145)
TCO2: 21 mmol/L — ABNORMAL LOW (ref 22–32)

## 2018-11-14 LAB — POCT I-STAT EG7
Acid-base deficit: 5 mmol/L — ABNORMAL HIGH (ref 0.0–2.0)
Bicarbonate: 21 mmol/L (ref 20.0–28.0)
Calcium, Ion: 1.31 mmol/L (ref 1.15–1.40)
HCT: 44 % (ref 39.0–52.0)
Hemoglobin: 15 g/dL (ref 13.0–17.0)
O2 Saturation: 99 %
Potassium: 4.2 mmol/L (ref 3.5–5.1)
Sodium: 127 mmol/L — ABNORMAL LOW (ref 135–145)
TCO2: 22 mmol/L (ref 22–32)
pCO2, Ven: 40.7 mmHg — ABNORMAL LOW (ref 44.0–60.0)
pH, Ven: 7.322 (ref 7.250–7.430)
pO2, Ven: 125 mmHg — ABNORMAL HIGH (ref 32.0–45.0)

## 2018-11-14 LAB — GLUCOSE, CAPILLARY
Glucose-Capillary: 140 mg/dL — ABNORMAL HIGH (ref 70–99)
Glucose-Capillary: 144 mg/dL — ABNORMAL HIGH (ref 70–99)
Glucose-Capillary: 152 mg/dL — ABNORMAL HIGH (ref 70–99)
Glucose-Capillary: 218 mg/dL — ABNORMAL HIGH (ref 70–99)
Glucose-Capillary: 229 mg/dL — ABNORMAL HIGH (ref 70–99)
Glucose-Capillary: 270 mg/dL — ABNORMAL HIGH (ref 70–99)
Glucose-Capillary: 286 mg/dL — ABNORMAL HIGH (ref 70–99)

## 2018-11-14 LAB — BASIC METABOLIC PANEL
Anion gap: 9 (ref 5–15)
BUN: 20 mg/dL (ref 6–20)
CO2: 21 mmol/L — ABNORMAL LOW (ref 22–32)
Calcium: 9.2 mg/dL (ref 8.9–10.3)
Chloride: 101 mmol/L (ref 98–111)
Creatinine, Ser: 0.87 mg/dL (ref 0.61–1.24)
GFR calc Af Amer: >60 mL/min (ref >60)
GFR calc non Af Amer: >60 mL/min (ref >60)
Glucose, Bld: 192 mg/dL — ABNORMAL HIGH (ref 70–99)
Potassium: 4 mmol/L (ref 3.5–5.1)
Sodium: 131 mmol/L — ABNORMAL LOW (ref 135–145)

## 2018-11-14 LAB — URINALYSIS, ROUTINE W REFLEX MICROSCOPIC
Bacteria, UA: NONE SEEN
Bilirubin Urine: NEGATIVE
Glucose, UA: >=500 mg/dL — AB
Hgb urine dipstick: NEGATIVE
Ketones, ur: 80 mg/dL — AB
Leukocytes,Ua: NEGATIVE
Nitrite: NEGATIVE
Protein, ur: NEGATIVE mg/dL
Specific Gravity, Urine: 1.026 (ref 1.005–1.030)
pH: 5 (ref 5.0–8.0)

## 2018-11-14 LAB — LACTIC ACID, PLASMA
Lactic Acid, Venous: 1.9 mmol/L (ref 0.5–1.9)
Lactic Acid, Venous: 1.5 mmol/L (ref 0.5–1.9)

## 2018-11-14 LAB — COMPREHENSIVE METABOLIC PANEL
ALT: 29 U/L (ref 0–44)
AST: 27 U/L (ref 15–41)
Albumin: 2.2 g/dL — ABNORMAL LOW (ref 3.5–5.0)
Alkaline Phosphatase: 189 U/L — ABNORMAL HIGH (ref 38–126)
Anion gap: 16 — ABNORMAL HIGH (ref 5–15)
BUN: 22 mg/dL — ABNORMAL HIGH (ref 6–20)
CO2: 20 mmol/L — ABNORMAL LOW (ref 22–32)
Calcium: 9.7 mg/dL (ref 8.9–10.3)
Chloride: 92 mmol/L — ABNORMAL LOW (ref 98–111)
Creatinine, Ser: 1.13 mg/dL (ref 0.61–1.24)
GFR calc Af Amer: >60 mL/min (ref >60)
GFR calc non Af Amer: >60 mL/min (ref >60)
Glucose, Bld: 374 mg/dL — ABNORMAL HIGH (ref 70–99)
Potassium: 4.9 mmol/L (ref 3.5–5.1)
Sodium: 128 mmol/L — ABNORMAL LOW (ref 135–145)
Total Bilirubin: 1.2 mg/dL (ref 0.3–1.2)
Total Protein: 6.2 g/dL — ABNORMAL LOW (ref 6.5–8.1)

## 2018-11-14 LAB — CBG MONITORING, ED
Glucose-Capillary: 365 mg/dL — ABNORMAL HIGH (ref 70–99)
Glucose-Capillary: 345 mg/dL — ABNORMAL HIGH (ref 70–99)

## 2018-11-14 LAB — PROTIME-INR
INR: 1 (ref 0.8–1.2)
Prothrombin Time: 13.3 seconds (ref 11.4–15.2)

## 2018-11-14 LAB — SARS CORONAVIRUS 2 BY RT PCR (HOSPITAL ORDER, PERFORMED IN ~~LOC~~ HOSPITAL LAB): SARS Coronavirus 2: NEGATIVE

## 2018-11-14 LAB — APTT: aPTT: 26 seconds (ref 24–36)

## 2018-11-14 SURGERY — IRRIGATION AND DEBRIDEMENT ABSCESS
Anesthesia: General | Site: Back

## 2018-11-14 MED ORDER — SODIUM CHLORIDE 0.9 % IR SOLN
Status: DC | PRN
  Administered 2018-11-14: 3000 mL

## 2018-11-14 MED ORDER — ACETAMINOPHEN 10 MG/ML IV SOLN
INTRAVENOUS | Status: AC
  Filled 2018-11-14: qty 100

## 2018-11-14 MED ORDER — LACTATED RINGERS IV BOLUS (SEPSIS)
1000.0000 mL | Freq: Once | INTRAVENOUS | Status: AC
  Administered 2018-11-14: 1000 mL via INTRAVENOUS

## 2018-11-14 MED ORDER — SODIUM CHLORIDE 0.9 % IV SOLN
INTRAVENOUS | Status: DC | PRN
  Administered 2018-11-14: 19:00:00 via INTRAVENOUS

## 2018-11-14 MED ORDER — ONDANSETRON HCL 4 MG/2ML IJ SOLN
INTRAMUSCULAR | Status: AC
  Filled 2018-11-14: qty 2

## 2018-11-14 MED ORDER — INSULIN REGULAR(HUMAN) IN NACL 100-0.9 UT/100ML-% IV SOLN
INTRAVENOUS | Status: DC
  Administered 2018-11-14: 4 [IU]/h via INTRAVENOUS

## 2018-11-14 MED ORDER — FENTANYL CITRATE (PF) 100 MCG/2ML IJ SOLN
25.0000 ug | INTRAMUSCULAR | Status: DC | PRN
  Administered 2018-11-14 (×2): 50 ug via INTRAVENOUS

## 2018-11-14 MED ORDER — VANCOMYCIN HCL 10 G IV SOLR
2500.0000 mg | Freq: Once | INTRAVENOUS | Status: AC
  Administered 2018-11-14: 2500 mg via INTRAVENOUS
  Filled 2018-11-14: qty 2500

## 2018-11-14 MED ORDER — OXYCODONE HCL 5 MG PO TABS
5.0000 mg | ORAL_TABLET | ORAL | Status: DC | PRN
  Administered 2018-11-15 – 2018-11-16 (×5): 10 mg via ORAL
  Administered 2018-11-16: 5 mg via ORAL
  Administered 2018-11-17: 10 mg via ORAL
  Filled 2018-11-14 (×7): qty 2

## 2018-11-14 MED ORDER — LACTATED RINGERS IV BOLUS (SEPSIS): 500.0000 mL | Freq: Once | INTRAVENOUS | Status: DC

## 2018-11-14 MED ORDER — PHENYLEPHRINE 40 MCG/ML (10ML) SYRINGE FOR IV PUSH (FOR BLOOD PRESSURE SUPPORT)
PREFILLED_SYRINGE | INTRAVENOUS | Status: AC
  Filled 2018-11-14: qty 10

## 2018-11-14 MED ORDER — SODIUM CHLORIDE 0.9 % IV SOLN: INTRAVENOUS | Status: DC

## 2018-11-14 MED ORDER — INSULIN REGULAR(HUMAN) IN NACL 100-0.9 UT/100ML-% IV SOLN
INTRAVENOUS | Status: DC
  Administered 2018-11-14: 3.1 [IU]/h via INTRAVENOUS
  Filled 2018-11-14: qty 100

## 2018-11-14 MED ORDER — LIDOCAINE 2% (20 MG/ML) 5 ML SYRINGE
INTRAMUSCULAR | Status: AC
  Filled 2018-11-14: qty 5

## 2018-11-14 MED ORDER — OXYCODONE HCL 5 MG/5ML PO SOLN: 5.0000 mg | Freq: Once | ORAL | Status: DC | PRN

## 2018-11-14 MED ORDER — FENTANYL CITRATE (PF) 250 MCG/5ML IJ SOLN
INTRAMUSCULAR | Status: DC | PRN
  Administered 2018-11-14 (×2): 25 ug via INTRAVENOUS
  Administered 2018-11-14: 50 ug via INTRAVENOUS

## 2018-11-14 MED ORDER — ACETAMINOPHEN 10 MG/ML IV SOLN
INTRAVENOUS | Status: DC | PRN
  Administered 2018-11-14: 1000 mg via INTRAVENOUS

## 2018-11-14 MED ORDER — PIPERACILLIN-TAZOBACTAM 3.375 G IVPB
3.3750 g | Freq: Three times a day (TID) | INTRAVENOUS | Status: DC
  Administered 2018-11-15 – 2018-11-17 (×8): 3.375 g via INTRAVENOUS
  Filled 2018-11-14 (×7): qty 50

## 2018-11-14 MED ORDER — ACETAMINOPHEN 650 MG RE SUPP: 650.0000 mg | Freq: Four times a day (QID) | RECTAL | Status: DC | PRN

## 2018-11-14 MED ORDER — SUCCINYLCHOLINE CHLORIDE 200 MG/10ML IV SOSY
PREFILLED_SYRINGE | INTRAVENOUS | Status: DC | PRN
  Administered 2018-11-14: 100 mg via INTRAVENOUS

## 2018-11-14 MED ORDER — ONDANSETRON HCL 4 MG/2ML IJ SOLN
INTRAMUSCULAR | Status: DC | PRN
  Administered 2018-11-14: 4 mg via INTRAVENOUS

## 2018-11-14 MED ORDER — MIDAZOLAM HCL 2 MG/2ML IJ SOLN
INTRAMUSCULAR | Status: AC
  Filled 2018-11-14: qty 2

## 2018-11-14 MED ORDER — ONDANSETRON HCL 4 MG/2ML IJ SOLN: 4.0000 mg | Freq: Four times a day (QID) | INTRAMUSCULAR | Status: DC | PRN

## 2018-11-14 MED ORDER — DEXTROSE-NACL 5-0.45 % IV SOLN: INTRAVENOUS | Status: DC

## 2018-11-14 MED ORDER — LIDOCAINE 2% (20 MG/ML) 5 ML SYRINGE
INTRAMUSCULAR | Status: DC | PRN
  Administered 2018-11-14: 60 mg via INTRAVENOUS

## 2018-11-14 MED ORDER — PROPOFOL 10 MG/ML IV BOLUS
INTRAVENOUS | Status: AC
  Filled 2018-11-14: qty 20

## 2018-11-14 MED ORDER — SUCCINYLCHOLINE CHLORIDE 200 MG/10ML IV SOSY
PREFILLED_SYRINGE | INTRAVENOUS | Status: AC
  Filled 2018-11-14: qty 10

## 2018-11-14 MED ORDER — ONDANSETRON HCL 4 MG/2ML IJ SOLN
4.0000 mg | Freq: Once | INTRAMUSCULAR | Status: AC
  Administered 2018-11-14: 4 mg via INTRAVENOUS
  Filled 2018-11-14: qty 2

## 2018-11-14 MED ORDER — LACTATED RINGERS IV SOLN
INTRAVENOUS | Status: DC
  Administered 2018-11-14: 18:00:00 via INTRAVENOUS

## 2018-11-14 MED ORDER — VANCOMYCIN HCL 10 G IV SOLR
1250.0000 mg | Freq: Two times a day (BID) | INTRAVENOUS | Status: AC
  Administered 2018-11-15: 1250 mg via INTRAVENOUS
  Filled 2018-11-14 (×2): qty 1250

## 2018-11-14 MED ORDER — HYDROMORPHONE HCL 1 MG/ML IJ SOLN
1.0000 mg | Freq: Once | INTRAMUSCULAR | Status: AC
  Administered 2018-11-14: 1 mg via INTRAVENOUS
  Filled 2018-11-14: qty 1

## 2018-11-14 MED ORDER — PROPOFOL 10 MG/ML IV BOLUS
INTRAVENOUS | Status: DC | PRN
  Administered 2018-11-14: 140 mg via INTRAVENOUS

## 2018-11-14 MED ORDER — VANCOMYCIN HCL 10 G IV SOLR
1250.0000 mg | Freq: Two times a day (BID) | INTRAVENOUS | Status: DC
  Filled 2018-11-14 (×2): qty 1250

## 2018-11-14 MED ORDER — ROCURONIUM BROMIDE 10 MG/ML (PF) SYRINGE
PREFILLED_SYRINGE | INTRAVENOUS | Status: DC | PRN
  Administered 2018-11-14: 40 mg via INTRAVENOUS

## 2018-11-14 MED ORDER — CALCIUM CHLORIDE 10 % IV SOLN
INTRAVENOUS | Status: AC
  Filled 2018-11-14: qty 10

## 2018-11-14 MED ORDER — LACTATED RINGERS IV BOLUS (SEPSIS): 1000.0000 mL | Freq: Once | INTRAVENOUS | Status: DC

## 2018-11-14 MED ORDER — FENTANYL CITRATE (PF) 250 MCG/5ML IJ SOLN
INTRAMUSCULAR | Status: AC
  Filled 2018-11-14: qty 5

## 2018-11-14 MED ORDER — ENOXAPARIN SODIUM 40 MG/0.4ML ~~LOC~~ SOLN
40.0000 mg | SUBCUTANEOUS | Status: DC
  Administered 2018-11-15 – 2018-11-19 (×5): 40 mg via SUBCUTANEOUS
  Filled 2018-11-14 (×5): qty 0.4

## 2018-11-14 MED ORDER — POTASSIUM CHLORIDE 10 MEQ/100ML IV SOLN
10.0000 meq | INTRAVENOUS | Status: AC
  Administered 2018-11-14 (×2): 10 meq via INTRAVENOUS
  Filled 2018-11-14 (×2): qty 100

## 2018-11-14 MED ORDER — DEXTROSE-NACL 5-0.45 % IV SOLN
INTRAVENOUS | Status: DC
  Administered 2018-11-14: 22:00:00 via INTRAVENOUS

## 2018-11-14 MED ORDER — PIPERACILLIN-TAZOBACTAM 3.375 G IVPB: 3.3750 g | Freq: Three times a day (TID) | INTRAVENOUS | Status: DC

## 2018-11-14 MED ORDER — SUGAMMADEX SODIUM 200 MG/2ML IV SOLN
INTRAVENOUS | Status: DC | PRN
  Administered 2018-11-14: 200 mg via INTRAVENOUS

## 2018-11-14 MED ORDER — ACETAMINOPHEN 325 MG PO TABS: 650.0000 mg | ORAL_TABLET | Freq: Four times a day (QID) | ORAL | Status: DC | PRN

## 2018-11-14 MED ORDER — OXYCODONE HCL 5 MG PO TABS: 5.0000 mg | ORAL_TABLET | Freq: Once | ORAL | Status: DC | PRN

## 2018-11-14 MED ORDER — 0.9 % SODIUM CHLORIDE (POUR BTL) OPTIME
TOPICAL | Status: DC | PRN
  Administered 2018-11-14: 19:00:00 1000 mL

## 2018-11-14 MED ORDER — MORPHINE SULFATE (PF) 2 MG/ML IV SOLN
1.0000 mg | INTRAVENOUS | Status: DC | PRN
  Administered 2018-11-15 (×5): 2 mg via INTRAVENOUS
  Filled 2018-11-14 (×5): qty 1

## 2018-11-14 MED ORDER — FENTANYL CITRATE (PF) 100 MCG/2ML IJ SOLN
INTRAMUSCULAR | Status: AC
  Filled 2018-11-14: qty 2

## 2018-11-14 MED ORDER — PIPERACILLIN-TAZOBACTAM 3.375 G IVPB 30 MIN
3.3750 g | Freq: Once | INTRAVENOUS | Status: AC
  Administered 2018-11-14: 3.375 g via INTRAVENOUS
  Filled 2018-11-14: qty 50

## 2018-11-14 SURGICAL SUPPLY — 36 items
BLADE CLIPPER SURG (BLADE) IMPLANT
BNDG GAUZE ELAST 4 BULKY (GAUZE/BANDAGES/DRESSINGS) ×3 IMPLANT
CANISTER SUCT 3000ML PPV (MISCELLANEOUS) ×3 IMPLANT
COVER SURGICAL LIGHT HANDLE (MISCELLANEOUS) ×3 IMPLANT
COVER WAND RF STERILE (DRAPES) ×3 IMPLANT
DRAPE LAPAROSCOPIC ABDOMINAL (DRAPES) IMPLANT
DRAPE LAPAROTOMY 100X72 PEDS (DRAPES) IMPLANT
DRSG PAD ABDOMINAL 8X10 ST (GAUZE/BANDAGES/DRESSINGS) IMPLANT
ELECT REM PT RETURN 9FT ADLT (ELECTROSURGICAL) ×3
ELECTRODE REM PT RTRN 9FT ADLT (ELECTROSURGICAL) ×1 IMPLANT
GAUZE SPONGE 4X4 12PLY STRL (GAUZE/BANDAGES/DRESSINGS) IMPLANT
GAUZE SPONGE 4X4 12PLY STRL LF (GAUZE/BANDAGES/DRESSINGS) ×3 IMPLANT
GLOVE BIO SURGEON STRL SZ7.5 (GLOVE) ×3 IMPLANT
GLOVE BIOGEL PI IND STRL 7.0 (GLOVE) ×1 IMPLANT
GLOVE BIOGEL PI INDICATOR 7.0 (GLOVE) ×2
GLOVE SURG SS PI 7.0 STRL IVOR (GLOVE) ×3 IMPLANT
GOWN STRL REUS W/ TWL LRG LVL3 (GOWN DISPOSABLE) ×2 IMPLANT
GOWN STRL REUS W/ TWL XL LVL3 (GOWN DISPOSABLE) ×1 IMPLANT
GOWN STRL REUS W/TWL LRG LVL3 (GOWN DISPOSABLE) ×4
GOWN STRL REUS W/TWL XL LVL3 (GOWN DISPOSABLE) ×2
KIT BASIN OR (CUSTOM PROCEDURE TRAY) ×3 IMPLANT
KIT TURNOVER KIT B (KITS) ×3 IMPLANT
NEEDLE 22X1 1/2 (OR ONLY) (NEEDLE) IMPLANT
NS IRRIG 1000ML POUR BTL (IV SOLUTION) ×3 IMPLANT
PACK GENERAL/GYN (CUSTOM PROCEDURE TRAY) ×3 IMPLANT
PAD ABD 8X10 STRL (GAUZE/BANDAGES/DRESSINGS) ×3 IMPLANT
PAD ARMBOARD 7.5X6 YLW CONV (MISCELLANEOUS) ×3 IMPLANT
PENCIL SMOKE EVACUATOR (MISCELLANEOUS) ×3 IMPLANT
PULSAVAC PLUS IRRIG FAN TIP (DISPOSABLE) ×3
SWAB COLLECTION DEVICE MRSA (MISCELLANEOUS) ×3 IMPLANT
SWAB CULTURE ESWAB REG 1ML (MISCELLANEOUS) ×3 IMPLANT
SWAB CULTURE LIQUID MINI MALE (MISCELLANEOUS) ×3 IMPLANT
TAPE CLOTH SURG 6X10 WHT LF (GAUZE/BANDAGES/DRESSINGS) ×3 IMPLANT
TIP FAN IRRIG PULSAVAC PLUS (DISPOSABLE) ×1 IMPLANT
TOWEL GREEN STERILE (TOWEL DISPOSABLE) ×3 IMPLANT
TOWEL GREEN STERILE FF (TOWEL DISPOSABLE) ×3 IMPLANT

## 2018-11-14 NOTE — Op Note (Signed)
11/14/2018  7:31 PM  PATIENT:  Drew Evans.  58 y.o. male  PRE-OPERATIVE DIAGNOSIS:  Back Abcess  POST-OPERATIVE DIAGNOSIS: right back abscess  PROCEDURE:  Procedure(s): INCISION, DRAINAGE AND EXCISIONAL DEBRIDEMENT OF SKIN, SOFT TISSUE & MUSCLE OF RIGHT BACK ABSCESS  SURGEON:  Surgeon(s): [name], MD  ASSISTANTS: none   ANESTHESIA:   general  DRAINS: none   LOCAL MEDICATIONS USED:  NONE  EBL: 25 CC  SPECIMEN:  Source of Specimen:  AEROBIC/ANAEROBIC CULTURES  DISPOSITION OF SPECIMEN:  MICRO  COUNTS:  YES  INDICATION FOR PROCEDURE: 58 year old Caucasian male came to the emergency room earlier today for worsening right upper back abscess.  It started about 2 weeks ago.  He was placed on antibiotics.  It got progressively worse and he started having spontaneous drainage along with nausea and some vomiting so he came to the emergency room.  He was found to have spontaneous drainage from the right upper back abscess.  He was also found to be in DKA.  He was started on insulin drip, IV antibiotics.  Admit him in the preop area.  We discussed risk and benefits of the procedure which are separately documented.  PROCEDURE: Obtaining informed consent the patient was taken to the OR 9 at [facility].  He was placed supine on the operating room table.  General endotracheal anesthesia was established.  He was then placed in the left lateral position with the appropriate padding between his legs and his arms in his axilla.  Beanbag was deflated and secured and the patient was strapped to the table.  Sequential compression devices have been placed.  The right back was prepped and draped in the usual standard surgical fashion with Betadine.  Surgical timeout was performed.  Patient was on scheduled therapeutic antibiotics.  He had a large area of cellulitis and induration with some areas of spontaneous drainage.  The bulk of the abscess was located over the right infrascapular  area.  The area of cellulitis measured 11 x 13 cm.  Initially began by incising midpoint of the abscess sharply with a #10 blade and connecting the 2 areas that were spontaneously draining to make an elliptical incision.  There is copious amount of purulent drainage.  Cultures were obtained.  He had a very large abscess cavity.  I continue to sequentially excised skin and soft tissue.  The abscess cavity was quite deep.  It was at least 7 cm deep.  I sharply debrided nonviable soft tissue and some of the muscle.  There were 3 different pockets that were deep.I ended up transecting with 1 of the muscles in order to further expose a deeper cavity.  The area was copiously irrigated with pulsatile lavage for 5 L of saline.  We then obtained hemostasis with electrocautery.  Wound dimensions were 8 cm vertical by 4 cm wide by 7 cm deep.  An entire Kerlix was placed in the abscess cavity followed by dry ABDs and tape.  Patient was placed supine and extubated and taken to the recovery room in stable condition.  All needle, instrument sponge counts were correct x2.  There were no immediate complications.  Patient tolerated procedure well    Tool used for debridement (curette, scapel, etc.)  scapel    Frequency of surgical debridement.   initial    Measurement of total devitalized tissue (wound surface) before and after surgical debridement.   See above; at end - 8x4x 7cm deep   Area and depth of devitalized  tissue removed from wound.  8x4x3 cm   Blood loss and description of tissue removed.  25 cc & skin, soft tissue & some muscle    Was there any viable tissue removed (measurements): yes, 2 x 1cm   PLAN OF CARE: Admit to inpatient   PATIENT DISPOSITION:  PACU - hemodynamically stable.   Delay start of Pharmacological VTE agent (>24hrs) due to surgical blood loss or risk of bleeding:  no  Mary SellaEric M. Andrey CampanileWilson, MD, FACS General, Bariatric, & Minimally Invasive Surgery [facility],  [place]

## 2018-11-14 NOTE — Anesthesia Preprocedure Evaluation (Signed)
Anesthesia Evaluation  Patient identified by MRN, date of birth, ID band Patient awake    Reviewed: Allergy & Precautions, H&P , NPO status , Patient's Chart, lab work & pertinent test results  Airway Mallampati: II   Neck ROM: full    Dental   Pulmonary neg pulmonary ROS,    breath sounds clear to auscultation       Cardiovascular hypertension,  Rhythm:regular Rate:Normal     Neuro/Psych    GI/Hepatic   Endo/Other  diabetes, Poorly Controlled, Type 2  Renal/GU      Musculoskeletal  (+) Arthritis ,   Abdominal   Peds  Hematology   Anesthesia Other Findings   Reproductive/Obstetrics                             Anesthesia Physical Anesthesia Plan  ASA: II  Anesthesia Plan: General   Post-op Pain Management:    Induction: Intravenous  PONV Risk Score and Plan: 2 and Ondansetron, Dexamethasone, Midazolam and Treatment may vary due to age or medical condition  Airway Management Planned: Oral ETT  Additional Equipment:   Intra-op Plan:   Post-operative Plan: Extubation in OR  Informed Consent: I have reviewed the patients History and Physical, chart, labs and discussed the procedure including the risks, benefits and alternatives for the proposed anesthesia with the patient or authorized representative who has indicated his/her understanding and acceptance.       Plan Discussed with: CRNA, Anesthesiologist and Surgeon  Anesthesia Plan Comments:         Anesthesia Quick Evaluation

## 2018-11-14 NOTE — OR Nursing (Signed)
Care of patient assumed at 1910. 
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
## 2018-11-14 NOTE — H&P (Signed)
Date: 11/14/2018               Patient Name:  Drew Evans. MRN: [id number]  DOB: [birth year] Age / Sex: 58 y.o., male   PCP: Patient, No Pcp Per         Medical Service: Internal Medicine Teaching Service         Attending Physician: Dr. Evette Doffing, Mallie Mussel, *    First Contact: Dr. Jeanmarie Hubert Pager: [phone]  Second Contact: Dr. Kathi Ludwig Pager: [phone]       After Hours (After 5p/  First Contact Pager: [phone]  weekends / holidays): Second Contact Pager: [phone]   Chief Complaint: Back abscess  History of Present Illness: Patient is a 58 year old male with past medical history significant for diabetes who presents with chief complaint of back abscess.  Patient reports that he developed a back cyst about 3 to 4 weeks ago and the cyst has gradually grown in size, increased in pain, and has been oozing a clearish/creamy fluid.  He went to a family doctor about 2 weeks ago who placed him on a sulfa drug, but this antibiotic has not helped.  Patient reports that this occurred a couple times when he was in college but went away on its own, did not require antibiotics or hospitalization.  Patient reports that the only other medication that he takes is metformin which he takes once daily, never been on insulin.  Does not take blood pressure medications.  Patient endorses subjective fevers. Patient denies vomiting, abdominal pain, dysuria, urinary frequency, diarrhea, constipation.  Patient reports that many years ago he had a surgery where he had 2 rods placed in his back, denies recent surgery or history of cardiac surgery.  Patient denies recreational drug use including IV drugs.  Meds:  * Bactrim 800-160 BID * Metformin 500 mg QD  Allergies: Allergies as of 11/14/2018 - Review Complete 11/14/2018  Allergen Reaction Noted  . No known allergies  02/07/2016    Family History:  * Father had "heart issues" * Father and grandfather had diabetes  Social History:   * Denies Alcohol usage, tobacco smoking, recreational drug usage  Review of Systems: A complete ROS was negative except as per HPI.    Physical Exam: Blood pressure 135/71, pulse (!) 110, temperature 99.1 F (37.3 C), temperature source Oral, resp. rate 19, height 6' (1.829 m), weight 108.9 kg, SpO2 93 %. Physical Exam  Constitutional: He is well-developed, well-nourished, and in no distress.  HENT:  Head: Normocephalic and atraumatic.  Eyes: EOM are normal. Right eye exhibits no discharge. Left eye exhibits no discharge.  Neck: Normal range of motion. No tracheal deviation present.  Cardiovascular: Normal rate and regular rhythm. Exam reveals no gallop and no friction rub.  No murmur heard. Pulmonary/Chest: Effort normal and breath sounds normal. No respiratory distress. He has no wheezes. He has no rales.  Abdominal: Soft. He exhibits no distension. There is no abdominal tenderness. There is no rebound and no guarding.  Musculoskeletal: Normal range of motion.        General: Tenderness present. No deformity or edema.  Neurological: He is alert. Coordination normal.  Skin: Skin is warm and dry. He is not diaphoretic. There is erythema. No pallor.  Large abscess measuring 16 cm in diameter in mid upper back. Erythematous with fluctuance, draining significant amount of purulent fluid.  Psychiatric: Memory and judgment normal.         EKG: personally reviewed my interpretation  is sinus tachycardia  CXR: personally reviewed my interpretation is not performed  Assessment & Plan by Problem: Active Problems:   Diabetes mellitus, new onset (HCC)   Hypertension   Abscess of back   Sepsis [facility](HCC)  Patient is a 58 year old male who presents with a 3 to 4-week history of back swelling, drainage and was found to have a back abscess and DKA.  # Back abscess with sepsis: 3 to 4-week history of back abscess, was on Bactrim as outpatient.  Large 16 cm abscess with purulent, foul-smelling  drainage on exam.  General surgery on board, I&D today.  CBC with white count of 29.5, lactic acid of 1.9, afebrile, tachycardic with heart rate 110-130.  Patient with sluggish speech but alert and oriented. * Blood cultures x2 drawn * Started on empiric abx therapy with vancomycin and zosyn, creatinine of 0.80 today * 3.5 L of LR bolus given in ER, on D5-1/2NS 125 mL/hr  # Diabetic ketoacidosis: On presentation, glucose of 374, anion gap of 16, bicarb of 20, VBG with pH of 7.32, urinalysis with ketones.  Urinalysis leukocyte/nitrate negative.  Patient on metformin as outpatient, never been on insulin.  DKA likely triggered by infection.  Insulin drip and fluid resuscitation started and patient now taken to surgery. * Initiation of DKA protocol, DKA protocol will be considered after surgery - fluid resuscitation, insulin drip, potassium supplementation, and Q4HR BMPs * Electrolytes: Sodium of 127, corrected of 134. Potassium of 4.2. Will monitor   Diet: NPO DVT Ppx: SCDs  Dispo: Admit patient to Inpatient with expected length of stay greater than 2 midnights.  Signed: Katherine RoanMacLean, Alzena Gerber, MD 11/14/2018, 6:28 PM  Pager: [phone]

## 2018-11-14 NOTE — ED Triage Notes (Signed)
Patient reports abscess to mid/upper back x 1 month that has progressively worsened and opened up 4 days ago. Large amount of swelling and redness noted surrounding open wound with light brown discharge. Denies fevers/chills.

## 2018-11-14 NOTE — Transfer of Care (Signed)
Immediate Anesthesia Transfer of Care Note  Patient: Drew Evans.  Procedure(s) Performed: IRRIGATION AND DEBRIDEMENT BACK ABSCESS (N/A Back)  Patient Location: PACU  Anesthesia Type:General  Level of Consciousness: awake and alert   Airway & Oxygen Therapy: Patient Spontanous Breathing and Patient connected to nasal cannula oxygen  Post-op Assessment: Report given to RN and Post -op Vital signs reviewed and stable  Post vital signs: Reviewed and stable  Last Vitals:  Vitals Value Taken Time  BP 114/65 11/14/18 1945  Temp    Pulse 110 11/14/18 1954  Resp 22 11/14/18 1955  SpO2 98 % 11/14/18 1954  Vitals shown include unvalidated device data.  Last Pain:  Vitals:   11/14/18 1222  TempSrc: Oral  PainSc:          Complications: No apparent anesthesia complications
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
## 2018-11-14 NOTE — Anesthesia Postprocedure Evaluation (Signed)
Anesthesia Post Note  Patient: Drew Evans.  Procedure(s) Performed: IRRIGATION AND DEBRIDEMENT BACK ABSCESS (N/A Back)     Patient location during evaluation: PACU Anesthesia Type: General Level of consciousness: awake and alert Pain management: pain level controlled Vital Signs Assessment: post-procedure vital signs reviewed and stable Respiratory status: spontaneous breathing, nonlabored ventilation and respiratory function stable Cardiovascular status: blood pressure returned to baseline and stable Postop Assessment: no apparent nausea or vomiting Anesthetic complications: no    Last Vitals:  Vitals:   11/14/18 2116 11/14/18 2131  BP: 138/68 115/65  Pulse: (!) 101 95  Resp: 16 15  Temp:    SpO2: 97% 97%    Last Pain:  Vitals:   11/14/18 2100  TempSrc:   PainSc: 4                  Richell Corker,W. EDMOND

## 2018-11-14 NOTE — Progress Notes (Signed)
Pharmacy Antibiotic Note  Drew Evans. is a 58 y.o. male admitted on 11/14/2018 with sepsis - abscess to his back. Previously on Bactrim, but symptoms continued to worsen. WBC elevated 29.5, Scr 0.8. Pharmacy has been consulted for Zosyn and Vancomycin dosing.  Plan: Zosyn 3.375 g IV over 30 mins once Vancomycin 2500 mg IV once Zosyn 3.375 g IV q8h Vancomycin 1250 mg IV q12h (estAUC 483, goal AUC 400-550, Scr used 0.8) Monitor renal function Monitor C/s  Height: 6' (182.9 cm) Weight: 240 lb (108.9 kg) IBW/kg (Calculated) : 77.6  Temp (24hrs), Avg:99.1 F (37.3 C), Min:99.1 F (37.3 C), Max:99.1 F (37.3 C)  Recent Labs  Lab 11/14/18 1226 11/14/18 1328 11/14/18 1411  WBC 29.5*  --   --   CREATININE 1.13  --  0.80  LATICACIDVEN  --  1.9  --     Estimated Creatinine Clearance: 126.7 mL/min (by C-G formula based on SCr of 0.8 mg/dL).    Allergies  Allergen Reactions  . No Known Allergies     Antimicrobials this admission: Zosyn 9/10 >> Vancomycin 9/10 >>  Microbiology results: 9/10 BCx: pending    Thank you for allowing pharmacy to be a part of this patient's care.  Lorel Monaco, PharmD PGY1 Ambulatory Care Resident [employer] # [phone]

## 2018-11-14 NOTE — Interval H&P Note (Signed)
History and Physical Interval Note:  11/14/2018 6:02 PM  Drew Evans.  has presented today for surgery, with the diagnosis of Back Abcess.  The various methods of treatment have been discussed with the patient and family. After consideration of risks, benefits and other options for treatment, the patient has consented to  Procedure(s): IRRIGATION AND DEBRIDEMENT BACK ABSCESS (N/A) as a surgical intervention.  The patient's history has been reviewed, patient examined, no change in status, stable for surgery.  I have reviewed the patient's chart and labs.  Questions were answered to the patient's satisfaction.    Discussed risk/benefits (bleeding, ongoing infection, scarring, chronic wound, blood clots, perioperative cardiac/pulm events, need for addl procedures, injury to surrounding structures, wound care after surgery )and typical postop course  [name]

## 2018-11-14 NOTE — Anesthesia Procedure Notes (Signed)
Procedure Name: Intubation Date/Time: 11/14/2018 6:30 PM Performed by: Inda Coke, CRNA Pre-anesthesia Checklist: Patient identified, Emergency Drugs available, Suction available and Patient being monitored Patient Re-evaluated:Patient Re-evaluated prior to induction Oxygen Delivery Method: Circle System Utilized Preoxygenation: Pre-oxygenation with 100% oxygen Induction Type: IV induction Ventilation: Mask ventilation without difficulty and Oral airway inserted - appropriate to patient size Laryngoscope Size: Mac and 4 Grade View: Grade III Tube type: Oral Tube size: 7.5 mm Number of attempts: 1 Airway Equipment and Method: Stylet and Oral airway Placement Confirmation: ETT inserted through vocal cords under direct vision,  positive ETCO2 and breath sounds checked- equal and bilateral Secured at: 22 cm Tube secured with: Tape Dental Injury: Teeth and Oropharynx as per pre-operative assessment  Difficulty Due To: Difficulty was anticipated Future Recommendations: Recommend- induction with short-acting agent, and alternative techniques readily available

## 2018-11-15 ENCOUNTER — Inpatient Hospital Stay (HOSPITAL_COMMUNITY): Payer: Medicare Other

## 2018-11-15 ENCOUNTER — Encounter (HOSPITAL_COMMUNITY): Payer: Self-pay | Admitting: General Surgery

## 2018-11-15 DIAGNOSIS — A419 Sepsis, unspecified organism: Principal | ICD-10-CM

## 2018-11-15 DIAGNOSIS — L8990 Pressure ulcer of unspecified site, unspecified stage: Secondary | ICD-10-CM | POA: Insufficient documentation

## 2018-11-15 DIAGNOSIS — E1110 Type 2 diabetes mellitus with ketoacidosis without coma: Secondary | ICD-10-CM

## 2018-11-15 DIAGNOSIS — L03312 Cellulitis of back [any part except buttock]: Secondary | ICD-10-CM

## 2018-11-15 DIAGNOSIS — L02212 Cutaneous abscess of back [any part, except buttock]: Secondary | ICD-10-CM

## 2018-11-15 LAB — COMPREHENSIVE METABOLIC PANEL
ALT: 19 U/L (ref 0–44)
AST: 17 U/L (ref 15–41)
Albumin: 1.6 g/dL — ABNORMAL LOW (ref 3.5–5.0)
Alkaline Phosphatase: 123 U/L (ref 38–126)
Anion gap: 9 (ref 5–15)
BUN: 18 mg/dL (ref 6–20)
CO2: 22 mmol/L (ref 22–32)
Calcium: 8.7 mg/dL — ABNORMAL LOW (ref 8.9–10.3)
Chloride: 101 mmol/L (ref 98–111)
Creatinine, Ser: 0.66 mg/dL (ref 0.61–1.24)
GFR calc Af Amer: >60 mL/min (ref >60)
GFR calc non Af Amer: >60 mL/min (ref >60)
Glucose, Bld: 135 mg/dL — ABNORMAL HIGH (ref 70–99)
Potassium: 3.7 mmol/L (ref 3.5–5.1)
Sodium: 132 mmol/L — ABNORMAL LOW (ref 135–145)
Total Bilirubin: 0.5 mg/dL (ref 0.3–1.2)
Total Protein: 4.6 g/dL — ABNORMAL LOW (ref 6.5–8.1)

## 2018-11-15 LAB — BASIC METABOLIC PANEL
Anion gap: 10 (ref 5–15)
Anion gap: 11 (ref 5–15)
Anion gap: 7 (ref 5–15)
Anion gap: 9 (ref 5–15)
BUN: 13 mg/dL (ref 6–20)
BUN: 17 mg/dL (ref 6–20)
BUN: 18 mg/dL (ref 6–20)
BUN: 20 mg/dL (ref 6–20)
CO2: 21 mmol/L — ABNORMAL LOW (ref 22–32)
CO2: 23 mmol/L (ref 22–32)
CO2: 23 mmol/L (ref 22–32)
CO2: 23 mmol/L (ref 22–32)
Calcium: 8.4 mg/dL — ABNORMAL LOW (ref 8.9–10.3)
Calcium: 8.6 mg/dL — ABNORMAL LOW (ref 8.9–10.3)
Calcium: 8.6 mg/dL — ABNORMAL LOW (ref 8.9–10.3)
Calcium: 8.9 mg/dL (ref 8.9–10.3)
Chloride: 100 mmol/L (ref 98–111)
Chloride: 101 mmol/L (ref 98–111)
Chloride: 102 mmol/L (ref 98–111)
Chloride: 99 mmol/L (ref 98–111)
Creatinine, Ser: 0.67 mg/dL (ref 0.61–1.24)
Creatinine, Ser: 0.8 mg/dL (ref 0.61–1.24)
Creatinine, Ser: 0.82 mg/dL (ref 0.61–1.24)
Creatinine, Ser: 0.93 mg/dL (ref 0.61–1.24)
GFR calc Af Amer: >60 mL/min (ref >60)
GFR calc Af Amer: >60 mL/min (ref >60)
GFR calc Af Amer: >60 mL/min (ref >60)
GFR calc Af Amer: >60 mL/min (ref >60)
GFR calc non Af Amer: >60 mL/min (ref >60)
GFR calc non Af Amer: >60 mL/min (ref >60)
GFR calc non Af Amer: >60 mL/min (ref >60)
GFR calc non Af Amer: >60 mL/min (ref >60)
Glucose, Bld: 133 mg/dL — ABNORMAL HIGH (ref 70–99)
Glucose, Bld: 152 mg/dL — ABNORMAL HIGH (ref 70–99)
Glucose, Bld: 161 mg/dL — ABNORMAL HIGH (ref 70–99)
Glucose, Bld: 198 mg/dL — ABNORMAL HIGH (ref 70–99)
Potassium: 3.6 mmol/L (ref 3.5–5.1)
Potassium: 3.7 mmol/L (ref 3.5–5.1)
Potassium: 3.9 mmol/L (ref 3.5–5.1)
Potassium: 3.9 mmol/L (ref 3.5–5.1)
Sodium: 132 mmol/L — ABNORMAL LOW (ref 135–145)
Sodium: 132 mmol/L — ABNORMAL LOW (ref 135–145)
Sodium: 132 mmol/L — ABNORMAL LOW (ref 135–145)
Sodium: 133 mmol/L — ABNORMAL LOW (ref 135–145)

## 2018-11-15 LAB — CBC
HCT: 33.5 % — ABNORMAL LOW (ref 39.0–52.0)
Hemoglobin: 11.5 g/dL — ABNORMAL LOW (ref 13.0–17.0)
MCH: 27.9 pg (ref 26.0–34.0)
MCHC: 34.3 g/dL (ref 30.0–36.0)
MCV: 81.3 fL (ref 80.0–100.0)
Platelets: 222 10*3/uL (ref 150–400)
RBC: 4.12 MIL/uL — ABNORMAL LOW (ref 4.22–5.81)
RDW: 13.2 % (ref 11.5–15.5)
WBC: 24.5 10*3/uL — ABNORMAL HIGH (ref 4.0–10.5)
nRBC: 0 % (ref 0.0–0.2)

## 2018-11-15 LAB — GLUCOSE, CAPILLARY
Glucose-Capillary: 121 mg/dL — ABNORMAL HIGH (ref 70–99)
Glucose-Capillary: 137 mg/dL — ABNORMAL HIGH (ref 70–99)
Glucose-Capillary: 141 mg/dL — ABNORMAL HIGH (ref 70–99)
Glucose-Capillary: 141 mg/dL — ABNORMAL HIGH (ref 70–99)
Glucose-Capillary: 167 mg/dL — ABNORMAL HIGH (ref 70–99)
Glucose-Capillary: 209 mg/dL — ABNORMAL HIGH (ref 70–99)
Glucose-Capillary: 212 mg/dL — ABNORMAL HIGH (ref 70–99)
Glucose-Capillary: 279 mg/dL — ABNORMAL HIGH (ref 70–99)

## 2018-11-15 LAB — URINE CULTURE: Culture: NO GROWTH

## 2018-11-15 LAB — HIV ANTIBODY (ROUTINE TESTING W REFLEX): HIV Screen 4th Generation wRfx: NONREACTIVE

## 2018-11-15 LAB — MAGNESIUM: Magnesium: 1.9 mg/dL (ref 1.7–2.4)

## 2018-11-15 LAB — PHOSPHORUS: Phosphorus: 3 mg/dL (ref 2.5–4.6)

## 2018-11-15 MED ORDER — INSULIN ASPART 100 UNIT/ML ~~LOC~~ SOLN
0.0000 [IU] | Freq: Every day | SUBCUTANEOUS | Status: DC
  Administered 2018-11-15: 2 [IU] via SUBCUTANEOUS
  Administered 2018-11-16: 3 [IU] via SUBCUTANEOUS
  Administered 2018-11-17: 5 [IU] via SUBCUTANEOUS

## 2018-11-15 MED ORDER — INSULIN ASPART 100 UNIT/ML ~~LOC~~ SOLN
3.0000 [IU] | Freq: Three times a day (TID) | SUBCUTANEOUS | Status: DC
  Administered 2018-11-15: 3 [IU] via SUBCUTANEOUS

## 2018-11-15 MED ORDER — BACITRACIN-NEOMYCIN-POLYMYXIN OINTMENT TUBE
TOPICAL_OINTMENT | Freq: Every day | CUTANEOUS | Status: DC
  Administered 2018-11-15 – 2018-11-24 (×4): via TOPICAL
  Filled 2018-11-15: qty 14

## 2018-11-15 MED ORDER — ONDANSETRON HCL 4 MG/2ML IJ SOLN
4.0000 mg | Freq: Once | INTRAMUSCULAR | Status: AC
  Administered 2018-11-15: 4 mg via INTRAVENOUS

## 2018-11-15 MED ORDER — INSULIN GLARGINE 100 UNIT/ML ~~LOC~~ SOLN
7.0000 [IU] | Freq: Once | SUBCUTANEOUS | Status: AC
  Administered 2018-11-15: 7 [IU] via SUBCUTANEOUS
  Filled 2018-11-15: qty 0.07

## 2018-11-15 MED ORDER — VANCOMYCIN HCL 10 G IV SOLR
1250.0000 mg | Freq: Two times a day (BID) | INTRAVENOUS | Status: DC
  Administered 2018-11-15 – 2018-11-21 (×12): 1250 mg via INTRAVENOUS
  Filled 2018-11-15 (×16): qty 1250

## 2018-11-15 MED ORDER — INSULIN ASPART 100 UNIT/ML ~~LOC~~ SOLN
0.0000 [IU] | Freq: Three times a day (TID) | SUBCUTANEOUS | Status: DC
  Administered 2018-11-15: 8 [IU] via SUBCUTANEOUS
  Administered 2018-11-16 (×2): 5 [IU] via SUBCUTANEOUS
  Administered 2018-11-16: 8 [IU] via SUBCUTANEOUS
  Administered 2018-11-17 (×2): 5 [IU] via SUBCUTANEOUS
  Administered 2018-11-18 (×2): 3 [IU] via SUBCUTANEOUS
  Administered 2018-11-18 – 2018-11-19 (×3): 5 [IU] via SUBCUTANEOUS
  Administered 2018-11-20: 3 [IU] via SUBCUTANEOUS
  Administered 2018-11-20: 2 [IU] via SUBCUTANEOUS
  Administered 2018-11-21: 3 [IU] via SUBCUTANEOUS
  Administered 2018-11-21: 09:00:00 2 [IU] via SUBCUTANEOUS

## 2018-11-15 MED ORDER — INSULIN GLARGINE 100 UNIT/ML ~~LOC~~ SOLN
10.0000 [IU] | Freq: Every morning | SUBCUTANEOUS | Status: DC
  Filled 2018-11-15: qty 0.1

## 2018-11-15 MED ORDER — GABAPENTIN 300 MG PO CAPS
300.0000 mg | ORAL_CAPSULE | Freq: Two times a day (BID) | ORAL | Status: DC
  Administered 2018-11-15 – 2018-11-24 (×18): 300 mg via ORAL
  Filled 2018-11-15 (×18): qty 1

## 2018-11-15 MED ORDER — INSULIN ASPART 100 UNIT/ML ~~LOC~~ SOLN
0.0000 [IU] | Freq: Three times a day (TID) | SUBCUTANEOUS | Status: DC
  Administered 2018-11-15: 2 [IU] via SUBCUTANEOUS
  Administered 2018-11-15: 3 [IU] via SUBCUTANEOUS

## 2018-11-15 NOTE — Consult Note (Signed)
[facility] Nurse wound consult note Reason for Consult: Consult requested for gluteal fold. Wound type: Pt states that this is a chronic wound that he had, "on and off for years; it will heal and then reopen."  The etiology is unknown and he uses Neosporin at home. This is a full thickness wound, NOT a pressure injury. Measurement: 3X.2X.2cm Wound bed: red and moist, small amt tan drainage, no odor Periwound: intact skin surrounding Dressing procedure/placement/frequency:Topical treatment orders provided for bedside nurse to perform daily:  Apply Neosporin to gluteal fold wound Q day, then cover with foam dressing.  (Change foam dressing Q 3 days or PRN soiling.) Please re-consult if further assistance is needed.  Thank-you,  Julien Girt MSN, [place], [place], [place], [place]

## 2018-11-15 NOTE — Plan of Care (Signed)
  Problem: Education: Goal: Knowledge of General Education information will improve Description Including pain rating scale, medication(s)/side effects and non-pharmacologic comfort measures Outcome: Progressing   Problem: Health Behavior/Discharge Planning: Goal: Ability to manage health-related needs will improve Outcome: Progressing   

## 2018-11-15 NOTE — Progress Notes (Signed)
Patient with complaint of the hiccups x2 weeks. Resident paged. Talked to Patient's Aunt and gave updates.

## 2018-11-15 NOTE — Progress Notes (Signed)
  Subjective:  Patient seen at bedside this morning.  Patient says he has not tried eating anything yet, but has some ginger ale and water.  Patient says he does not currently have a doctor.  Previously saw a provider for his back abscess but does not wish to return to that physician due to concerns with management.  Patient reports his last A1c was approximately 7 several months ago.  Patient reports some back pain at the site of surgery but no pain anywhere else.    Objective:    Vital Signs (last 24 hours): Vitals:   11/15/18 0305 11/15/18 0400 11/15/18 0500 11/15/18 0640  BP: 121/67 (!) 110/59 (!) 100/50   Pulse: 83 74 74 92  Resp: 16 16  13   Temp: 98.3 F (36.8 C)     TempSrc: Oral     SpO2: 99% 97% 97% 98%  Weight:      Height:        Physical Exam: General Alert and answers questions appropriately, no acute distress  Cardiac Regular rate and rhythm, no murmurs, rubs, or gallops  Skin Wound on back with bandages. Purulent drainage present  Extremities No peripheral edema    Assessment/Plan:   Active Problems:   Diabetes mellitus, new onset ([place])   Hypertension   Abscess of back   Sepsis [facility])  Patient is a 58 year old male who presents with a 3 to 4-week history of back swelling, drainage was found to have a back abscess and DKA.  # Back abscess with sepsis: Patient with large 16 cm abscess with purulent, foul-smelling drainage on presentation.  Blood cultures x2 drawn in ED and no growth < 24 hours.  Patient started on broad-spectrum antibiotics with vancomycin and Zosyn.  I&D performed by surgery with area of cellulitis measuring 11 x 13 cm, pocket was at least 7 cm deep.  Intraoperative cultures acquired. * Follow-up blood cultures and intraoperative cultures * Continue empiric antibiotics with vancomycin and Zosyn, will monitor kidney function  # Diabetic Ketoacidosis: On presentation, glucose of 374, anion gap of 16, bicarb of 20, VBG with pH of 7.32, UA with  ketones.  DKA likely triggered by infection.  DKA protocol was initiated following surgery.  Patient transition to subcu insulin at 0300 this morning.  A.M. labs show anion gap of 7, bicarb 23. Patient with elevated glucose today * Will increase basal insulin to lantus 10 U tomorrow AM * Increased from sensitive to moderate SSI and added 3 units mealtime coverage  Diet: Carb modified DVT Ppx: SCDs, holding lovenox for >24 hours following surgery yesterday Dispo: Anticipated discharge pending clinical improvement  Jeanmarie Hubert, MD 11/15/2018, 7:52 AM Pager: [phone]

## 2018-11-16 LAB — BASIC METABOLIC PANEL
Anion gap: 11 (ref 5–15)
BUN: 13 mg/dL (ref 6–20)
CO2: 20 mmol/L — ABNORMAL LOW (ref 22–32)
Calcium: 8.2 mg/dL — ABNORMAL LOW (ref 8.9–10.3)
Chloride: 100 mmol/L (ref 98–111)
Creatinine, Ser: 0.76 mg/dL (ref 0.61–1.24)
GFR calc Af Amer: >60 mL/min (ref >60)
GFR calc non Af Amer: >60 mL/min (ref >60)
Glucose, Bld: 200 mg/dL — ABNORMAL HIGH (ref 70–99)
Potassium: 4 mmol/L (ref 3.5–5.1)
Sodium: 131 mmol/L — ABNORMAL LOW (ref 135–145)

## 2018-11-16 LAB — CBC
HCT: 35.7 % — ABNORMAL LOW (ref 39.0–52.0)
Hemoglobin: 11.8 g/dL — ABNORMAL LOW (ref 13.0–17.0)
MCH: 27.6 pg (ref 26.0–34.0)
MCHC: 33.1 g/dL (ref 30.0–36.0)
MCV: 83.6 fL (ref 80.0–100.0)
Platelets: 246 10*3/uL (ref 150–400)
RBC: 4.27 MIL/uL (ref 4.22–5.81)
RDW: 13 % (ref 11.5–15.5)
WBC: 16.6 10*3/uL — ABNORMAL HIGH (ref 4.0–10.5)
nRBC: 0 % (ref 0.0–0.2)

## 2018-11-16 LAB — GLUCOSE, CAPILLARY
Glucose-Capillary: 203 mg/dL — ABNORMAL HIGH (ref 70–99)
Glucose-Capillary: 282 mg/dL — ABNORMAL HIGH (ref 70–99)
Glucose-Capillary: 238 mg/dL — ABNORMAL HIGH (ref 70–99)
Glucose-Capillary: 265 mg/dL — ABNORMAL HIGH (ref 70–99)

## 2018-11-16 LAB — HEMOGLOBIN A1C
Hgb A1c MFr Bld: 12 % — ABNORMAL HIGH (ref 4.8–5.6)
Mean Plasma Glucose: 298 mg/dL

## 2018-11-16 MED ORDER — INSULIN GLARGINE 100 UNIT/ML ~~LOC~~ SOLN
25.0000 [IU] | Freq: Every morning | SUBCUTANEOUS | Status: DC
  Administered 2018-11-17: 25 [IU] via SUBCUTANEOUS
  Filled 2018-11-16 (×2): qty 0.25

## 2018-11-16 MED ORDER — INSULIN ASPART 100 UNIT/ML ~~LOC~~ SOLN: 5.0000 [IU] | Freq: Three times a day (TID) | SUBCUTANEOUS | Status: DC

## 2018-11-16 MED ORDER — INSULIN GLARGINE 100 UNIT/ML ~~LOC~~ SOLN: 20.0000 [IU] | Freq: Every morning | SUBCUTANEOUS | Status: DC

## 2018-11-16 MED ORDER — CHLORPROMAZINE HCL 25 MG/ML IJ SOLN: 25.0000 mg | Freq: Three times a day (TID) | INTRAMUSCULAR | Status: DC | PRN

## 2018-11-16 MED ORDER — INSULIN GLARGINE 100 UNIT/ML ~~LOC~~ SOLN
16.0000 [IU] | Freq: Every morning | SUBCUTANEOUS | Status: DC
  Administered 2018-11-16: 16 [IU] via SUBCUTANEOUS
  Filled 2018-11-16: qty 0.16

## 2018-11-16 MED ORDER — INSULIN ASPART 100 UNIT/ML ~~LOC~~ SOLN
3.0000 [IU] | Freq: Three times a day (TID) | SUBCUTANEOUS | Status: DC
  Administered 2018-11-16: 3 [IU] via SUBCUTANEOUS

## 2018-11-16 NOTE — Progress Notes (Addendum)
   Subjective: Patient stated he is feeling much improved today.  He is mentally more clear than the prior day.  Although his back continues to be painful is more manageable.  He is agreeable to continued IV antibiotics and close monitoring of his insulin levels.  He denied complaints today.  All questions were answered.  Objective:  Vital signs in last 24 hours: Vitals:   11/15/18 1658 11/15/18 2058 11/16/18 0003 11/16/18 0352  BP: 130/82 (!) 118/51 (!) 105/47 (!) 129/49  Pulse: 98 92 78 86  Resp: 18 16 17 17   Temp: 98.2 F (36.8 C) 99 F (37.2 C) 98.3 F (36.8 C) 97.8 F (36.6 C)  TempSrc: Oral Oral Oral Oral  SpO2: 98% 97% 95% 99%  Weight:      Height:       General: A/O x4, in no acute distress, afebrile, nondiaphoretic HEENT: PEERL, EMO intact Pulmonary: CTA bilaterally, no wheezing or crackles  Abdomen: soft, nontender  MSK: The patient's back wound has a clean base, no purulent drainage observed today. Psych: Appropriate affect, not depressed in appearance, engages well  Assessment/Plan:  Principal Problem:   Abscess of back Active Problems:   DKA (diabetic ketoacidoses) ([place])   Hypertension   Sepsis ([place])   Pressure injury of skin  Patient is a 58 year old male with past medical history notable for poorly controlled diabetes who presented with 3 to 4-week history of back pain and swelling.  He is found to have a large deep tissue abscess of the back is amenable to surgical I&D in the OR.  Patient was also notably in mild DKA at the time of admission for he was placed on and insulin infusion.  A/P: Back abscess complicated sepsis: Patient status post I&D in the OR general surgery.  He is improving antibiotics but continues to have significant pain.  CBC is a.m. demonstrating improved leukocytosis down to 16,000.  Patient continues on Zosyn and vancomycin at this time while awaiting deep tissue culture results.  Blood culture 1/2 with few gram-positive rods indicating  likely contaminant but will await BCID.  It appears that surgery does not plan for exploration at this time as he continues to improve. -Continue Vanco/Zosyn until cultures result -Continue morphine 1 to 2 mg every 2 hours as needed for pain -Appreciate surgery's continued assistance and recommendations -Continue topical wound care  DKA: Diabetes mellitus type 2: DKA now resolved.  Hemoglobin A1c 12%.  Patient's hemoglobin poorly controlled over the past 12 hours.  We will up titrate his insulin and attempt to maintain serum glucose levels below 150 to improve wound healing.  BMP this a.m. stable with glucose 200, bicarb 20 sodium 131 and serum creatinine 0.76. Patient has not been getting his full insulin as ordered. Will attempt to insure that he does. (Addendum: it appears that he has not been eating full meals). -Increase Lantus to 16 units daily today, will increase to 25 tomorrow -Continue SSI moderate 3 times daily with meals in addition to mealtime insulin -Continue additional mealtime insulin increased from 3 to 5 beginning this evening  Diet: Carb modified Code: Full DVT prophylaxis: Enoxaparin Dispo: Anticipated discharge in approximately 2-3 day(s).   Kathi Ludwig, MD 11/16/2018, 7:55 AM Pager: # [phone]

## 2018-11-16 NOTE — Progress Notes (Signed)
PHARMACY - PHYSICIAN COMMUNICATION CRITICAL VALUE ALERT - BLOOD CULTURE IDENTIFICATION (BCID)  Drew Evans. is an 58 y.o. male who presented to [name] on 11/14/2018 with a chief complaint of back abscess  Assessment:  1/2 blood cx with gram positive rod, no bcid run d/t likely contaminant. Operative cultures of abscess are pending  Name of physician (or Provider) Contacted: Dr Sheppard Coil  Current antibiotics: Zosyn and Vanc  Changes to prescribed antibiotics recommended:  None, waiting for op cultures.   Levester Fresh, PharmD, BCPS, BCCCP Clinical Pharmacist [phone]  Please check AMION for all [place] numbers  11/16/2018 7:45 AM

## 2018-11-16 NOTE — Progress Notes (Signed)
Progress Note: General Surgery Service   Assessment/Plan: Principal Problem:   Abscess of back Active Problems:   DKA (diabetic ketoacidoses) (HCC)   Hypertension   Sepsis (HCC)   Pressure injury of skin  s/p Procedure(s): IRRIGATION AND DEBRIDEMENT BACK ABSCESS 11/14/2018  Back abscess - clean base -continue wound care -continue broad spectrum abx -f/u culture -carb modified diet -no plans for re exploration at this time   LOS: 2 days  Chief Complaint/Subjective: Continued back pain with movement, +hiccups for >24h  Objective: Vital signs in last 24 hours: Temp:  [97.8 F (36.6 C)-99 F (37.2 C)] 97.8 F (36.6 C) (09/12 0854) Pulse Rate:  [78-98] 89 (09/12 0854) Resp:  [13-18] 13 (09/12 0854) BP: (105-144)/(47-82) 125/69 (09/12 0854) SpO2:  [95 %-99 %] 96 % (09/12 0854) Last BM Date: 11/13/18  Intake/Output from previous day: 09/11 0701 - 09/12 0700 In: 857 [P.O.:240; IV Piggyback:617] Out: 400 [Urine:400] Intake/Output this shift: Total I/O In: -  Out: 325 [Urine:325]  Lungs: nonlabored  Cardiovascular: RRR  Back: deep wound with visible muscle belly at base, no necrosis or purulence, some fibrinous debris  Extremities: no edema  Neuro: AOx4  Lab Results: CBC  Recent Labs    11/15/18 0238 11/16/18 0317  WBC 24.5* 16.6*  HGB 11.5* 11.8*  HCT 33.5* 35.7*  PLT 222 246   BMET Recent Labs    11/15/18 1146 11/16/18 0317  NA 132* 131*  K 3.9 4.0  CL 99 100  CO2 23 20*  GLUCOSE 198* 200*  BUN 13 13  CREATININE 0.80 0.76  CALCIUM 8.4* 8.2*   PT/INR Recent Labs    11/14/18 1226  LABPROT 13.3  INR 1.0   ABG Recent Labs    11/14/18 1411  HCO3 21.0    Studies/Results:  Anti-infectives: Anti-infectives (From admission, onward)   Start     Dose/Rate Route Frequency Ordered Stop   11/15/18 1500  vancomycin (VANCOCIN) 1,250 mg in sodium chloride 0.9 % 250 mL IVPB     1,250 mg 166.7 mL/hr over 90 Minutes Intravenous Every 12  hours 11/15/18 1030     11/15/18 0230  vancomycin (VANCOCIN) 1,250 mg in sodium chloride 0.9 % 250 mL IVPB  Status:  Discontinued     1,250 mg 166.7 mL/hr over 90 Minutes Intravenous Every 12 hours 11/14/18 1433 11/14/18 2344   11/15/18 0000  piperacillin-tazobactam (ZOSYN) IVPB 3.375 g     3.375 g 12.5 mL/hr over 240 Minutes Intravenous Every 8 hours 11/14/18 2359     11/15/18 0000  vancomycin (VANCOCIN) 1,250 mg in sodium chloride 0.9 % 250 mL IVPB     1,250 mg 166.7 mL/hr over 90 Minutes Intravenous 2 times daily 11/14/18 2359 11/15/18 0332   11/14/18 2000  piperacillin-tazobactam (ZOSYN) IVPB 3.375 g  Status:  Discontinued     3.375 g 12.5 mL/hr over 240 Minutes Intravenous Every 8 hours 11/14/18 1433 11/14/18 2344   11/14/18 1345  vancomycin (VANCOCIN) 2,500 mg in sodium chloride 0.9 % 500 mL IVPB     2,500 mg 250 mL/hr over 120 Minutes Intravenous  Once 11/14/18 1344 11/14/18 1622   11/14/18 1345  piperacillin-tazobactam (ZOSYN) IVPB 3.375 g     3.375 g 100 mL/hr over 30 Minutes Intravenous  Once 11/14/18 1344 11/14/18 1441      Medications: Scheduled Meds: . enoxaparin (LOVENOX) injection  40 mg Subcutaneous Q24H  . gabapentin  300 mg Oral BID  . insulin aspart  0-15 Units Subcutaneous TID WC  .  insulin aspart  0-5 Units Subcutaneous QHS  . insulin aspart  3 Units Subcutaneous TID WC  . insulin glargine  16 Units Subcutaneous q morning - 10a  . neomycin-bacitracin-polymyxin   Topical Daily   Continuous Infusions: . piperacillin-tazobactam (ZOSYN)  IV 3.375 g (11/16/18 0030)  . vancomycin 1,250 mg (11/16/18 0324)   PRN Meds:.acetaminophen **OR** acetaminophen, chlorproMAZINE (THORAZINE) injection, morphine injection, oxyCODONE  Mickeal Skinner, MD [facility]

## 2018-11-16 NOTE — Progress Notes (Signed)
  Date: 11/16/2018  Patient name: Drew Evans.  Medical record number: [id number]  Date of birth: 08/31/1960   This patient's plan of care was discussed with the house staff. Please see Dr. Nelma Rothman note for complete details. I concur with his findings.   Sid Falcon, MD 11/16/2018, 8:10 PM

## 2018-11-17 DIAGNOSIS — R066 Hiccough: Secondary | ICD-10-CM

## 2018-11-17 DIAGNOSIS — K529 Noninfective gastroenteritis and colitis, unspecified: Secondary | ICD-10-CM

## 2018-11-17 LAB — GLUCOSE, CAPILLARY
Glucose-Capillary: 209 mg/dL — ABNORMAL HIGH (ref 70–99)
Glucose-Capillary: 235 mg/dL — ABNORMAL HIGH (ref 70–99)
Glucose-Capillary: 226 mg/dL — ABNORMAL HIGH (ref 70–99)
Glucose-Capillary: 216 mg/dL — ABNORMAL HIGH (ref 70–99)

## 2018-11-17 LAB — CBC
HCT: 33 % — ABNORMAL LOW (ref 39.0–52.0)
Hemoglobin: 11.1 g/dL — ABNORMAL LOW (ref 13.0–17.0)
MCH: 27.8 pg (ref 26.0–34.0)
MCHC: 33.6 g/dL (ref 30.0–36.0)
MCV: 82.7 fL (ref 80.0–100.0)
Platelets: 220 10*3/uL (ref 150–400)
RBC: 3.99 MIL/uL — ABNORMAL LOW (ref 4.22–5.81)
RDW: 12.9 % (ref 11.5–15.5)
WBC: 13.1 10*3/uL — ABNORMAL HIGH (ref 4.0–10.5)
nRBC: 0 % (ref 0.0–0.2)

## 2018-11-17 LAB — BASIC METABOLIC PANEL
Anion gap: 9 (ref 5–15)
BUN: 9 mg/dL (ref 6–20)
CO2: 21 mmol/L — ABNORMAL LOW (ref 22–32)
Calcium: 7.9 mg/dL — ABNORMAL LOW (ref 8.9–10.3)
Chloride: 99 mmol/L (ref 98–111)
Creatinine, Ser: 0.68 mg/dL (ref 0.61–1.24)
GFR calc Af Amer: >60 mL/min (ref >60)
GFR calc non Af Amer: >60 mL/min (ref >60)
Glucose, Bld: 219 mg/dL — ABNORMAL HIGH (ref 70–99)
Potassium: 3.8 mmol/L (ref 3.5–5.1)
Sodium: 129 mmol/L — ABNORMAL LOW (ref 135–145)

## 2018-11-17 MED ORDER — BACLOFEN 20 MG PO TABS
20.0000 mg | ORAL_TABLET | Freq: Two times a day (BID) | ORAL | Status: DC | PRN
  Administered 2018-11-19: 20 mg via ORAL
  Filled 2018-11-17 (×3): qty 1

## 2018-11-17 MED ORDER — OXYCODONE HCL 5 MG PO TABS
5.0000 mg | ORAL_TABLET | Freq: Four times a day (QID) | ORAL | Status: DC | PRN
  Administered 2018-11-17: 10 mg via ORAL
  Administered 2018-11-17: 5 mg via ORAL
  Administered 2018-11-18 – 2018-11-23 (×13): 10 mg via ORAL
  Filled 2018-11-17 (×15): qty 2

## 2018-11-17 MED ORDER — SODIUM CHLORIDE 0.9 % IV SOLN
2.0000 g | INTRAVENOUS | Status: DC
  Administered 2018-11-17 – 2018-11-23 (×8): 2 g via INTRAVENOUS
  Filled 2018-11-17 (×4): qty 2
  Filled 2018-11-17: qty 20
  Filled 2018-11-17: qty 2
  Filled 2018-11-17: qty 20
  Filled 2018-11-17: qty 2
  Filled 2018-11-17: qty 20
  Filled 2018-11-17: qty 2

## 2018-11-17 NOTE — Progress Notes (Signed)
Pharmacy Antibiotic Note  Drew Evans. is a 58 y.o. male admitted on 11/14/2018 with sepsis - abscess to his back. Previously on Bactrim, but symptoms continued to worsen. Pharmacy has been consulted for Vancomycin dosing. Patient was changed from Zosyn to Ceftriaxone today.   -Culture still pending but GPR noted in 1/4 blood.  -WBC is trending down nicely -SCr has improved to probable baseline at 0.68   Plan: Continue Vancomycin 1250 mg IV q12h (estAUC 470, goal AUC 400-550, Scr used 0.8) Monitor renal function Monitor C/s If continue Vancomycin, will check levels in next 1-2 days  Height: 6' (182.9 cm) Weight: 246 lb 14.6 oz (112 kg) IBW/kg (Calculated) : 77.6  Temp (24hrs), Avg:98.2 F (36.8 C), Min:97.7 F (36.5 C), Max:98.8 F (37.1 C)  Recent Labs  Lab 11/14/18 1226 11/14/18 1328  11/14/18 2050  11/15/18 0238 11/15/18 0352 11/15/18 0735 11/15/18 1146 11/16/18 0317 11/17/18 0308  WBC 29.5*  --   --   --   --  24.5*  --   --   --  16.6* 13.1*  CREATININE 1.13  --    < >  --    < > 0.66 0.67 0.93 0.80 0.76 0.68  LATICACIDVEN  --  1.9  --  1.5  --   --   --   --   --   --   --    < > = values in this interval not displayed.    Estimated Creatinine Clearance: 128.5 mL/min (by C-G formula based on SCr of 0.68 mg/dL).    Allergies  Allergen Reactions  . No Known Allergies     Antimicrobials this admission: Zosyn 9/10 >>9/12 Vancomycin 9/10 >> Ceftriaxone 9/12 >>  Microbiology results: 9/10Bcx: Gram + rods 1/4 still pending 9/10 Wound culture: abundant Gram + rods, moderate gram + cocci, few gram - rods 9/10 Ucx: neg  Thank you for allowing pharmacy to be a part of this patient's care.  Sloan Leiter, PharmD, BCPS, BCCCP Clinical Pharmacist Clinical phone 11/17/2018 until [phone] Please refer to [facility] for [place] numbers 11/17/2018, 2:34 PM

## 2018-11-17 NOTE — Progress Notes (Signed)
  Date: 11/17/2018  Patient name: Drew Evans.  Medical record number: [id number]  Date of birth: January 13, 1961        I have seen and evaluated this patient and I have discussed the plan of care with the house staff. Please see Dr. Nelma Rothman note for complete details. I concur with his findings and plan.  Patient improving today.  Awaiting finalized culture results.   Sid Falcon, MD 11/17/2018, 2:41 PM

## 2018-11-17 NOTE — Progress Notes (Signed)
   Subjective: Patient states that his pain is improved today.  He feels overall he is improving less confused today.  He continues to have hiccups.  He requests to be seen by alternative clinic, in particular he request to be seen in our clinic as he feels he is in need of a new physician.  Objective:  Vital signs in last 24 hours: Vitals:   11/16/18 1641 11/16/18 1939 11/16/18 2328 11/17/18 0433  BP: 115/75 127/70 (!) 142/87 (!) 90/33  Pulse: 95 83 85 78  Resp: 15 16 16 15   Temp: 98.1 F (36.7 C) 98.8 F (37.1 C) 97.7 F (36.5 C) 98.2 F (36.8 C)  TempSrc: Oral Oral Oral Oral  SpO2: 99% 96% 97% 95%  Weight:      Height:       General: A/O x4, in no acute distress, afebrile, nondiaphoretic HEENT: PEERL, EMO intact Cardio: RRR, no mrg's  MSK: The wound edges been redressed and was described as clean and intact.  We will assess tomorrow morning Psych: Appropriate affect, not depressed in appearance, engages well  Assessment/Plan:  Principal Problem:   Abscess of back Active Problems:   DKA (diabetic ketoacidoses) ([place])   Hypertension   Sepsis ([place])   Pressure injury of skin  Patient is a 58 year old male with past medical history notable for poorly controlled diabetes who presented with 3 to 4-week history of back pain and swelling.  He is found to have a large deep tissue abscess of the back is amenable to surgical I&D in the OR. Patient was also notably in mild DKA at the time of admission for he was placed on and insulin infusion.  DKA improved rapidly and is placed on subcu insulin.  However, he has only been eating meals intermittently and as a result he has not been receiving his insulin as scheduled.   A/P: Back Abscess Complicated by Sepsis: Patient improving today. Surgery did not feel he needed a repeat I&D the prior day. Wound culture re-incubated for better growth. Awaiting speciation. We will need to continue broad spectrum antibiotics until these result as they  are growing gram positive rods, cocci and gram negative rods.  CBC with improvement in leukocytosis of 13.1 today.  Hemoglobin stable 11.1.  Blood cultures with gram-positive rods in 1/4 bottles.  Speciation pending. -Continue vancomycin per pharmacy -DC Zosyn - Start ceftriaxone 2 g IV every 24 hours - Awaiting culture results - Transfer from progressive to medical telemetry due to improved status  DKA: DMII: DKA is resolved. A1c 12%.  His glucose was 2 9 this morning but unfortunately, due to being understaffed, the patient's nurse was not able to administer his morning mealtime insulin as of yet.  I do not feel this will have a significant impact on his recovery. - Continue Lantus 25 units daily -Continue SSI moderate to TWC -Discontinue mealtime insulin  Diet: Carb modified Code: Full DVT prophylaxis: Enoxaparin Dispo: Anticipated discharge in approximately 2-3 day(s).   Kathi Ludwig, MD 11/17/2018, 6:23 AM Pager: # [phone]

## 2018-11-18 LAB — BASIC METABOLIC PANEL
Anion gap: 9 (ref 5–15)
BUN: 6 mg/dL (ref 6–20)
CO2: 24 mmol/L (ref 22–32)
Calcium: 7.6 mg/dL — ABNORMAL LOW (ref 8.9–10.3)
Chloride: 99 mmol/L (ref 98–111)
Creatinine, Ser: 0.62 mg/dL (ref 0.61–1.24)
GFR calc Af Amer: >60 mL/min (ref >60)
GFR calc non Af Amer: >60 mL/min (ref >60)
Glucose, Bld: 167 mg/dL — ABNORMAL HIGH (ref 70–99)
Potassium: 3.6 mmol/L (ref 3.5–5.1)
Sodium: 132 mmol/L — ABNORMAL LOW (ref 135–145)

## 2018-11-18 LAB — CBC
HCT: 32.2 % — ABNORMAL LOW (ref 39.0–52.0)
Hemoglobin: 10.7 g/dL — ABNORMAL LOW (ref 13.0–17.0)
MCH: 27.8 pg (ref 26.0–34.0)
MCHC: 33.2 g/dL (ref 30.0–36.0)
MCV: 83.6 fL (ref 80.0–100.0)
Platelets: 242 10*3/uL (ref 150–400)
RBC: 3.85 MIL/uL — ABNORMAL LOW (ref 4.22–5.81)
RDW: 12.6 % (ref 11.5–15.5)
WBC: 13.3 10*3/uL — ABNORMAL HIGH (ref 4.0–10.5)
nRBC: 0 % (ref 0.0–0.2)

## 2018-11-18 LAB — GLUCOSE, CAPILLARY
Glucose-Capillary: 174 mg/dL — ABNORMAL HIGH (ref 70–99)
Glucose-Capillary: 190 mg/dL — ABNORMAL HIGH (ref 70–99)
Glucose-Capillary: 238 mg/dL — ABNORMAL HIGH (ref 70–99)
Glucose-Capillary: 175 mg/dL — ABNORMAL HIGH (ref 70–99)

## 2018-11-18 MED ORDER — INSULIN GLARGINE 100 UNIT/ML ~~LOC~~ SOLN
30.0000 [IU] | Freq: Every morning | SUBCUTANEOUS | Status: DC
  Administered 2018-11-18 – 2018-11-19 (×2): 30 [IU] via SUBCUTANEOUS
  Filled 2018-11-18 (×2): qty 0.3

## 2018-11-18 MED ORDER — INSULIN ASPART 100 UNIT/ML ~~LOC~~ SOLN: 4.0000 [IU] | Freq: Three times a day (TID) | SUBCUTANEOUS | Status: DC

## 2018-11-18 MED ORDER — INSULIN ASPART 100 UNIT/ML ~~LOC~~ SOLN
5.0000 [IU] | Freq: Three times a day (TID) | SUBCUTANEOUS | Status: DC
  Administered 2018-11-18 – 2018-11-19 (×2): 5 [IU] via SUBCUTANEOUS

## 2018-11-18 MED ORDER — INSULIN ASPART 100 UNIT/ML ~~LOC~~ SOLN
3.0000 [IU] | Freq: Three times a day (TID) | SUBCUTANEOUS | Status: DC
  Administered 2018-11-18: 3 [IU] via SUBCUTANEOUS

## 2018-11-18 NOTE — Progress Notes (Addendum)
4 Days Post-Op  Subjective: CC: Patient reports that his back wound becomes less painful everyday. He still is requiring some oral pain medication before dressing changes. He has been having a new area of pain at the superior aspect of his neck over the last few days. Denies fever, chill, n/v. He is not a fan of the food.   Objective: Vital signs in last 24 hours: Temp:  [98.3 F (36.8 C)] 98.3 F (36.8 C) (09/14 0455) Pulse Rate:  [84-97] 84 (09/14 0455) Resp:  [15-19] 17 (09/14 0455) BP: (106-143)/(65-78) 106/71 (09/14 0455) SpO2:  [94 %-97 %] 97 % (09/14 0455) Last BM Date: 11/16/18  Intake/Output from previous day: 09/13 0701 - 09/14 0700 In: 1019 [P.O.:130; IV Piggyback:889] Out: 1497 [Urine:1125] Intake/Output this shift: No intake/output data recorded.  PE: Gen:  Alert, NAD, pleasant. Eating breakfast Pulm: Normal rate and effort  Ext:  No LE edema Psych: A&Ox3  Skin: Please see picture below. Patient with 12x16cm area of dark red, blanchable skin surrounding incision. Incision base with some fibrinous material inferiorly as well as healthy granulation tissue and some cauterized tissue. No purulence. The wound tracks medially and superiorly ~to where the redness ends on the skin level. There is an area of swelling and firmness without overt induration or fluctuance superior to where the redness ends. Unable to probe this from the wound. This measures roughly 4x4cm.          Lab Results:  Recent Labs    11/17/18 0308 11/18/18 0549  WBC 13.1* 13.3*  HGB 11.1* 10.7*  HCT 33.0* 32.2*  PLT 220 242   BMET Recent Labs    11/17/18 0308 11/18/18 0549  NA 129* 132*  K 3.8 3.6  CL 99 99  CO2 21* 24  GLUCOSE 219* 167*  BUN 9 6  CREATININE 0.68 0.62  CALCIUM 7.9* 7.6*   PT/INR No results for input(s): LABPROT, INR in the last 72 hours. CMP     Component Value Date/Time   NA 132 (L) 11/18/2018 0549   K 3.6 11/18/2018 0549   CL 99 11/18/2018 0549   CO2 24 11/18/2018 0549   GLUCOSE 167 (H) 11/18/2018 0549   BUN 6 11/18/2018 0549   CREATININE 0.62 11/18/2018 0549   CALCIUM 7.6 (L) 11/18/2018 0549   PROT 4.6 (L) 11/15/2018 0238   ALBUMIN 1.6 (L) 11/15/2018 0238   AST 17 11/15/2018 0238   ALT 19 11/15/2018 0238   ALKPHOS 123 11/15/2018 0238   BILITOT 0.5 11/15/2018 0238   GFRNONAA >60 11/18/2018 0549   GFRAA >60 11/18/2018 0549   Lipase  No results found for: LIPASE     Studies/Results: No results found.  Anti-infectives: Anti-infectives (From admission, onward)   Start     Dose/Rate Route Frequency Ordered Stop   11/17/18 1500  cefTRIAXone (ROCEPHIN) 2 g in sodium chloride 0.9 % 100 mL IVPB     2 g 200 mL/hr over 30 Minutes Intravenous Every 24 hours 11/17/18 1049     11/15/18 1500  vancomycin (VANCOCIN) 1,250 mg in sodium chloride 0.9 % 250 mL IVPB     1,250 mg 166.7 mL/hr over 90 Minutes Intravenous Every 12 hours 11/15/18 1030     11/15/18 0230  vancomycin (VANCOCIN) 1,250 mg in sodium chloride 0.9 % 250 mL IVPB  Status:  Discontinued     1,250 mg 166.7 mL/hr over 90 Minutes Intravenous Every 12 hours 11/14/18 1433 11/14/18 2344   11/15/18 0000  piperacillin-tazobactam (ZOSYN)  IVPB 3.375 g  Status:  Discontinued     3.375 g 12.5 mL/hr over 240 Minutes Intravenous Every 8 hours 11/14/18 2359 11/17/18 1049   11/15/18 0000  vancomycin (VANCOCIN) 1,250 mg in sodium chloride 0.9 % 250 mL IVPB     1,250 mg 166.7 mL/hr over 90 Minutes Intravenous 2 times daily 11/14/18 2359 11/15/18 0332   11/14/18 2000  piperacillin-tazobactam (ZOSYN) IVPB 3.375 g  Status:  Discontinued     3.375 g 12.5 mL/hr over 240 Minutes Intravenous Every 8 hours 11/14/18 1433 11/14/18 2344   11/14/18 1345  vancomycin (VANCOCIN) 2,500 mg in sodium chloride 0.9 % 500 mL IVPB     2,500 mg 250 mL/hr over 120 Minutes Intravenous  Once 11/14/18 1344 11/14/18 1622   11/14/18 1345  piperacillin-tazobactam (ZOSYN) IVPB 3.375 g     3.375 g 100 mL/hr over  30 Minutes Intravenous  Once 11/14/18 1344 11/14/18 1441       Assessment/Plan DM who presented in DKA  Back abscess  - S/p I&D INCISION, DRAINAGE AND EXCISIONAL DEBRIDEMENT OF SKIN, SOFT TISSUE & MUSCLE OF RIGHT BACK ABSCESS - Dr. Andrey CampanileWilson - 9/10  - POD #4  - Cx w/ no wbc seen on gram stain. Abundant gram + rods, mod gram + cocci, gew gram negative rods  - WBC downtrended and stable at 13  - Blood cx with gram + rods  - Continue IV abx  - Will discuss above exam with MD  FEN - CM VTE - SCDs, Lovenox ID - Rocephin/Vanc    LOS: 4 days   Jacinto HalimMichael M Maczis , [facility] 11/18/2018, 9:32 AM Pager: [phone]  Agree with above. It looks like his back will need further debridement.  Will plan on this tomorrow.  Discussed indications and potential complications of surgery. Glucose is 167 today  On disability since 2015.  Not married.  Has 59 yo son who lives with him.  Ovidio Kinavid Amelio Brosky, MD, [facility] [place] [facility] Pager: [phone] Office phone:  [phone]

## 2018-11-18 NOTE — TOC Benefit Eligibility Note (Addendum)
Transition of Care [facility]) Benefit Eligibility Note    Patient Details  Name: Drew Evans. MRN: [id number] Date of Birth: 01/22/1961   Medication/Dose:  LANTUS SOLOSTAR PEN, LEVEMIR FLEX TOUCH PEN  Covered?: Yes  Tier: 3 Drug  Prescription Coverage [facility]erred [facility]acy: [facility]   90 DAY SUPPLY FOR M/O $ 131.00  Spoke with Person/Company/Phone Number:: DEN  @ [employer] # [phone]  Co-Pay: $ 47.00 FOR EACH RX  Prior Approval: No   ADDISTIONAL : Elmon Else PENBaruch Gouty , P/A-YES # [phone]       Memory Argue Phone Number: 11/18/2018, 4:33 PM

## 2018-11-18 NOTE — Progress Notes (Signed)
Inpatient Diabetes Program Recommendations  AACE/ADA: New Consensus Statement on Inpatient Glycemic Control (2015)  Target Ranges:  Prepandial:   less than 140 mg/dL      Peak postprandial:   less than 180 mg/dL (1-2 hours)      Critically ill patients:  140 - 180 mg/dL   Lab Results  Component Value Date   GLUCAP 174 (H) 11/18/2018   HGBA1C 12.0 (H) 11/15/2018   Results for GEROGE, GILLIAM (MRN [id number]) as of 11/18/2018 11:27  Ref. Range 02/08/2016 20:29 11/15/2018 02:38  Hemoglobin A1C Latest Ref Range: 4.8 - 5.6 % 11.4 (H) 12.0 (H)   Review of Glycemic Control  Diabetes history: DM 2 Outpatient Diabetes medications: Metformin 500 mg Daily Current orders for Inpatient glycemic control:  Lantus 30 units Novolog 0-15 units tid + hs  Noted basal insulin increased to 30 units today. Will see patient today regarding A1c and starting insulin at home.  Note A1c 3 years ago 11.4%, pt needed insulin at that time.  1400 pm: Spoke with patient at bedside regarding A1c level of 12 % this admission and the possibility of needing insulin at time of d/c. Patient was diagnosed in 2017 during a hospitalization and was placed on metformin.  Patient reports not being able to tolerate metformin reporting diarrhea and concerns about the harmful effects he has heard about with the kidneys. Patient prefers to be on another oral medication at time of d/c in addition to insulin.  Patient reports being on limited income because he is on disability. Will place consult for benefits check on basal insulin for Case Management. Pt may follow up wait Internal medicine clinic for follow up care outpatient.  Showed patient how to use insulin pen and vial and syringe operation of insulin.  Spoke with patient about insulins at [facility] as a back up plan.  Thanks,  Tama Headings RN, MSN, BC-ADM Inpatient Diabetes Coordinator Team Pager [phone] (8a-5p)

## 2018-11-18 NOTE — Progress Notes (Addendum)
  Subjective:  Patient reports that he is doing well this morning.  Reports taking pain medicine twice per day which helps with his back pain.  Denies fever, chills, shortness of breath.  Patient states that he is selecting foods that are low in carbohydrates and understands that it is important to control his glucose in order to have good wound healing.  Objective:    Vital Signs (last 24 hours): Vitals:   11/17/18 0433 11/17/18 1525 11/17/18 2006 11/18/18 0455  BP: (!) 90/33 (!) 143/78 132/65 106/71  Pulse: 78 95 97 84  Resp: 15 15 19 17   Temp: 98.2 F (36.8 C)  98.3 F (36.8 C) 98.3 F (36.8 C)  TempSrc: Oral  Oral Oral  SpO2: 95% 96% 94% 97%  Weight:      Height:        Physical Exam: General Alert and answers questions appropriately, no acute distress  Cardiac Regular rate and rhythm, no murmurs, rubs, or gallops  Skin Back wound with packing. Some purulent drainage on gauze. Surrounding erythema present  Pulmonary Clear to auscultation bilaterally without wheezes, rhonchi, or rales      Assessment/Plan:   Principal Problem:   Abscess of back Active Problems:   DKA (diabetic ketoacidoses) ([place])   Hypertension   Sepsis ([place])   Pressure injury of skin   Enteritis  Patient is a 58 year old male with past medical history notable for poorly controlled diabetes who presented with a 3 to 4-week history of back pain and swelling.  He was found to have a large deep tissue abscess of the back and is now status post I&D in the OR.  Patient was in mild DKA at the time of admission and was placed on insulin drip.  DKA resolved rapidly and patient now on subcu insulin.  # Back abscess complicated by sepsis: Patient continues to improve.  Surgery did not feel he needed a repeat I&D.  Will check surgery recommendations today. Wound still with surrounding erythema, purulence (see image from today). Goal glucose 100-150 * Continue vancomycin and ceftriaxone * We will await speciation  on surgical cultures  # DKA: # T2DM: DKA has now resolved.  Hemoglobin A1c of 12%.  Patient received 25 units Lantus and 18 units of short acting yesterday.  Will increase regimen to obtain better control her glucose. *We will increase Lantus from 25 to 30 units every morning *We will add 4 units mealtime insulin. *Continue with moderate sliding scale   Diet: Carb modified DVT Ppx: Enoxaparin Dispo: Anticipated discharge in approximately 2-3 day(s).   Jeanmarie Hubert, MD 11/18/2018, 11:03 AM Pager: [phone]

## 2018-11-18 NOTE — Care Management Important Message (Signed)
Important Message  Patient Details  Name: Drew Evans. MRN: [id number] Date of Birth: 1960/06/24   Medicare Important Message Given:  Yes     Shelda Altes 11/18/2018, 1:42 PM

## 2018-11-19 LAB — CBC WITH DIFFERENTIAL/PLATELET
Abs Immature Granulocytes: 0.18 10*3/uL — ABNORMAL HIGH (ref 0.00–0.07)
Basophils Absolute: 0 10*3/uL (ref 0.0–0.1)
Basophils Relative: 0 %
Eosinophils Absolute: 0.3 10*3/uL (ref 0.0–0.5)
Eosinophils Relative: 3 %
HCT: 34.5 % — ABNORMAL LOW (ref 39.0–52.0)
Hemoglobin: 11.4 g/dL — ABNORMAL LOW (ref 13.0–17.0)
Immature Granulocytes: 2 %
Lymphocytes Relative: 19 %
Lymphs Abs: 2 10*3/uL (ref 0.7–4.0)
MCH: 27.7 pg (ref 26.0–34.0)
MCHC: 33 g/dL (ref 30.0–36.0)
MCV: 83.9 fL (ref 80.0–100.0)
Monocytes Absolute: 0.9 10*3/uL (ref 0.1–1.0)
Monocytes Relative: 8 %
Neutro Abs: 6.9 10*3/uL (ref 1.7–7.7)
Neutrophils Relative %: 68 %
Platelets: 264 10*3/uL (ref 150–400)
RBC: 4.11 MIL/uL — ABNORMAL LOW (ref 4.22–5.81)
RDW: 12.7 % (ref 11.5–15.5)
WBC: 10.3 10*3/uL (ref 4.0–10.5)
nRBC: 0 % (ref 0.0–0.2)

## 2018-11-19 LAB — BASIC METABOLIC PANEL
Anion gap: 10 (ref 5–15)
BUN: 5 mg/dL — ABNORMAL LOW (ref 6–20)
CO2: 26 mmol/L (ref 22–32)
Calcium: 8.2 mg/dL — ABNORMAL LOW (ref 8.9–10.3)
Chloride: 101 mmol/L (ref 98–111)
Creatinine, Ser: 0.63 mg/dL (ref 0.61–1.24)
GFR calc Af Amer: >60 mL/min (ref >60)
GFR calc non Af Amer: >60 mL/min (ref >60)
Glucose, Bld: 159 mg/dL — ABNORMAL HIGH (ref 70–99)
Potassium: 4.1 mmol/L (ref 3.5–5.1)
Sodium: 137 mmol/L (ref 135–145)

## 2018-11-19 LAB — GLUCOSE, CAPILLARY
Glucose-Capillary: 142 mg/dL — ABNORMAL HIGH (ref 70–99)
Glucose-Capillary: 230 mg/dL — ABNORMAL HIGH (ref 70–99)
Glucose-Capillary: 228 mg/dL — ABNORMAL HIGH (ref 70–99)
Glucose-Capillary: 158 mg/dL — ABNORMAL HIGH (ref 70–99)

## 2018-11-19 LAB — CULTURE, BLOOD (ROUTINE X 2)
Culture: NO GROWTH
Special Requests: ADEQUATE

## 2018-11-19 MED ORDER — INSULIN GLARGINE 100 UNIT/ML ~~LOC~~ SOLN
34.0000 [IU] | Freq: Every morning | SUBCUTANEOUS | Status: DC
  Administered 2018-11-20: 34 [IU] via SUBCUTANEOUS
  Filled 2018-11-19: qty 0.34

## 2018-11-19 MED ORDER — INSULIN ASPART 100 UNIT/ML ~~LOC~~ SOLN
6.0000 [IU] | Freq: Three times a day (TID) | SUBCUTANEOUS | Status: DC
  Administered 2018-11-19: 17:00:00 6 [IU] via SUBCUTANEOUS

## 2018-11-19 NOTE — Progress Notes (Signed)
  Subjective:  Patient says he feels well today.  Denies shortness of breath, fever, chills.  Patient understands the plan is for I&D tomorrow.  Patient comfortable plan of care, all questions answered.   Objective:    Vital Signs (last 24 hours): Vitals:   11/17/18 2006 11/18/18 0455 11/18/18 1949 11/19/18 0304  BP: 132/65 106/71 (!) 160/79 129/87  Pulse: 97 84 89 82  Resp: 19 17 15 12   Temp: 98.3 F (36.8 C) 98.3 F (36.8 C) 98.9 F (37.2 C) 98.1 F (36.7 C)  TempSrc: Oral Oral Axillary Oral  SpO2: 94% 97% 99% 96%  Weight:      Height:        Physical Exam: General Alert and answers questions appropriately, no acute distress  Cardiac Regular rate and rhythm, no murmurs, rubs, or gallops  Extremities No peripheral edema, no tenderness, erythema    Assessment/Plan:   Principal Problem:   Abscess of back Active Problems:   DKA (diabetic ketoacidoses) (HCC)   Hypertension   Sepsis ([place])   Pressure injury of skin   Enteritis  Patient is a 58 year old male with past medical history notable for poorly controlled diabetes who presented with a 3 to 4-week history of back pain and swelling.  Patient is postop day 5 after I&D per general surgery.  Patient with mild DKA at time admission which resolved rapidly and patient now on subcu insulin.  # Back abscess complicated by sepsis: Patient continues to improve.  Plan is for repeat I&D per surgery tomorrow.  Wound culture shows few normal skin for.   * Continue vancomycin and ceftriaxone (day 6)  # DKA: # T2DM: Conducting aggressive glucose control for wound healing with goal glucose less than 150.  Glucose of 142 this morning.  Glucose is elevated to 230 at noon but the patient did not receive insulin with breakfast this morning.  Patient has not received scheduled insulin with meals for multiple events. * Will increase regimen: Lantus 34 units + 6 units mealtime insulin + sliding scale insulin  Diet: Carb modified DVT Ppx:  Enoxaparin Dispo: Anticipated discharge in approximately 2-3 day(s).   Jeanmarie Hubert, MD 11/19/2018, 12:44 PM Pager: [phone]

## 2018-11-19 NOTE — Progress Notes (Addendum)
5 Days Post-Op  Subjective: CC: Patient reports he is comfortable. Tolerating dressing change to the back with pain medications. No fever or chills.   Objective: Vital signs in last 24 hours: Temp:  [98.1 F (36.7 C)-98.9 F (37.2 C)] 98.1 F (36.7 C) (09/15 0304) Pulse Rate:  [82-89] 82 (09/15 0304) Resp:  [12-15] 12 (09/15 0304) BP: (129-160)/(79-87) 129/87 (09/15 0304) SpO2:  [96 %-99 %] 96 % (09/15 0304) Last BM Date: 11/16/18  Intake/Output from previous day: 09/14 0701 - 09/15 0700 In: 240 [P.O.:240] Out: 2700 [Urine:2700] Intake/Output this shift: No intake/output data recorded.  PE: Gen:  Alert, NAD, pleasant. Eating breakfast Pulm: Normal rate and effort  Ext:  No LE edema Psych: A&Ox3  Skin: No change from picture yesterday. Patient with 12x16cm area of dark red, blanchable skin surrounding incision. Incision base with some fibrinous material inferiorly as well as healthy granulation tissue and some cauterized tissue. There was some purulence on the dressing. No gross purulence with probing. The wound tracks medially and superiorly ~to where the redness ends on the skin level. There is an area of swelling and firmness without overt induration or fluctuance superior to where the redness ends. Unable to probe this from the wound. This measures roughly 4x4cm  Lab Results:  Recent Labs    11/18/18 0549 11/19/18 0316  WBC 13.3* 10.3  HGB 10.7* 11.4*  HCT 32.2* 34.5*  PLT 242 264   BMET Recent Labs    11/18/18 0549 11/19/18 0316  NA 132* 137  K 3.6 4.1  CL 99 101  CO2 24 26  GLUCOSE 167* 159*  BUN 6 5*  CREATININE 0.62 0.63  CALCIUM 7.6* 8.2*   PT/INR No results for input(s): LABPROT, INR in the last 72 hours. CMP     Component Value Date/Time   NA 137 11/19/2018 0316   K 4.1 11/19/2018 0316   CL 101 11/19/2018 0316   CO2 26 11/19/2018 0316   GLUCOSE 159 (H) 11/19/2018 0316   BUN 5 (L) 11/19/2018 0316   CREATININE 0.63 11/19/2018 0316   CALCIUM 8.2 (L) 11/19/2018 0316   PROT 4.6 (L) 11/15/2018 0238   ALBUMIN 1.6 (L) 11/15/2018 0238   AST 17 11/15/2018 0238   ALT 19 11/15/2018 0238   ALKPHOS 123 11/15/2018 0238   BILITOT 0.5 11/15/2018 0238   GFRNONAA >60 11/19/2018 0316   GFRAA >60 11/19/2018 0316   Lipase  No results found for: LIPASE     Studies/Results: No results found.  Anti-infectives: Anti-infectives (From admission, onward)   Start     Dose/Rate Route Frequency Ordered Stop   11/17/18 1500  cefTRIAXone (ROCEPHIN) 2 g in sodium chloride 0.9 % 100 mL IVPB     2 g 200 mL/hr over 30 Minutes Intravenous Every 24 hours 11/17/18 1049     11/15/18 1500  vancomycin (VANCOCIN) 1,250 mg in sodium chloride 0.9 % 250 mL IVPB     1,250 mg 166.7 mL/hr over 90 Minutes Intravenous Every 12 hours 11/15/18 1030     11/15/18 0230  vancomycin (VANCOCIN) 1,250 mg in sodium chloride 0.9 % 250 mL IVPB  Status:  Discontinued     1,250 mg 166.7 mL/hr over 90 Minutes Intravenous Every 12 hours 11/14/18 1433 11/14/18 2344   11/15/18 0000  piperacillin-tazobactam (ZOSYN) IVPB 3.375 g  Status:  Discontinued     3.375 g 12.5 mL/hr over 240 Minutes Intravenous Every 8 hours 11/14/18 2359 11/17/18 1049   11/15/18 0000  vancomycin (  VANCOCIN) 1,250 mg in sodium chloride 0.9 % 250 mL IVPB     1,250 mg 166.7 mL/hr over 90 Minutes Intravenous 2 times daily 11/14/18 2359 11/15/18 0332   11/14/18 2000  piperacillin-tazobactam (ZOSYN) IVPB 3.375 g  Status:  Discontinued     3.375 g 12.5 mL/hr over 240 Minutes Intravenous Every 8 hours 11/14/18 1433 11/14/18 2344   11/14/18 1345  vancomycin (VANCOCIN) 2,500 mg in sodium chloride 0.9 % 500 mL IVPB     2,500 mg 250 mL/hr over 120 Minutes Intravenous  Once 11/14/18 1344 11/14/18 1622   11/14/18 1345  piperacillin-tazobactam (ZOSYN) IVPB 3.375 g     3.375 g 100 mL/hr over 30 Minutes Intravenous  Once 11/14/18 1344 11/14/18 1441       Assessment/Plan  DM who presented in DKA  Back  abscess             - S/p I&D INCISION, DRAINAGEANDEXCISIONALDEBRIDEMENTOF SKIN, SOFT TISSUE & MUSCLE OF RIGHTBACK ABSCESS - Dr. Andrey CampanileWilson - 9/10             - POD #5             - Cx w/ no wbc seen on gram stain. Abundant gram + rods, mod gram + cocci, gew gram negative rods             - WBC downtrending             - Blood cx with gram + rods             - Continue IV abx             - Will plan for take back to the OR tomorrow.   FEN - CM. NPO at midnight.  VTE - SCDs, Lovenox ID - Rocephin/Vanc    LOS: 4 days   Jacinto HalimMichael M Maczis , [facility] 11/18/2018, 9:32 AM Pager: [phone]   LOS: 5 days   Jacinto HalimMichael M Maczis , [facility] 11/19/2018, 8:54 AM Pager: [phone]  Agree with above.  Multiple emergencies delayed his surgery today.  Will plan to go back to the OR tomorrow for further debridement.  Ovidio Kinavid Kasara Schomer, MD, [facility] Pager: [phone] Office phone:  [phone]

## 2018-11-20 ENCOUNTER — Inpatient Hospital Stay (HOSPITAL_COMMUNITY): Payer: Medicare Other | Admitting: Anesthesiology

## 2018-11-20 ENCOUNTER — Encounter (HOSPITAL_COMMUNITY)
Admission: EM | Disposition: A | Discharge status: Home Health | Payer: Self-pay | Source: Home / Self Care | Attending: Student in an Organized Health Care Education/Training Program

## 2018-11-20 ENCOUNTER — Encounter (HOSPITAL_COMMUNITY): Payer: Self-pay | Admitting: *Deleted

## 2018-11-20 DIAGNOSIS — E118 Type 2 diabetes mellitus with unspecified complications: Secondary | ICD-10-CM

## 2018-11-20 HISTORY — PX: INCISION AND DRAINAGE OF WOUND: SHX1803

## 2018-11-20 LAB — CBC
HCT: 32.3 % — ABNORMAL LOW (ref 39.0–52.0)
Hemoglobin: 11.1 g/dL — ABNORMAL LOW (ref 13.0–17.0)
MCH: 28.5 pg (ref 26.0–34.0)
MCHC: 34.4 g/dL (ref 30.0–36.0)
MCV: 83 fL (ref 80.0–100.0)
Platelets: 336 10*3/uL (ref 150–400)
RBC: 3.89 MIL/uL — ABNORMAL LOW (ref 4.22–5.81)
RDW: 12.9 % (ref 11.5–15.5)
WBC: 10.3 10*3/uL (ref 4.0–10.5)
nRBC: 0 % (ref 0.0–0.2)

## 2018-11-20 LAB — GLUCOSE, CAPILLARY
Glucose-Capillary: 131 mg/dL — ABNORMAL HIGH (ref 70–99)
Glucose-Capillary: 122 mg/dL — ABNORMAL HIGH (ref 70–99)
Glucose-Capillary: 109 mg/dL — ABNORMAL HIGH (ref 70–99)
Glucose-Capillary: 159 mg/dL — ABNORMAL HIGH (ref 70–99)
Glucose-Capillary: 150 mg/dL — ABNORMAL HIGH (ref 70–99)

## 2018-11-20 LAB — BASIC METABOLIC PANEL
Anion gap: 10 (ref 5–15)
BUN: 7 mg/dL (ref 6–20)
CO2: 25 mmol/L (ref 22–32)
Calcium: 8.2 mg/dL — ABNORMAL LOW (ref 8.9–10.3)
Chloride: 101 mmol/L (ref 98–111)
Creatinine, Ser: 0.65 mg/dL (ref 0.61–1.24)
GFR calc Af Amer: >60 mL/min (ref >60)
GFR calc non Af Amer: >60 mL/min (ref >60)
Glucose, Bld: 131 mg/dL — ABNORMAL HIGH (ref 70–99)
Potassium: 3.8 mmol/L (ref 3.5–5.1)
Sodium: 136 mmol/L (ref 135–145)

## 2018-11-20 LAB — MRSA PCR SCREENING: MRSA by PCR: NEGATIVE

## 2018-11-20 LAB — AEROBIC/ANAEROBIC CULTURE W GRAM STAIN (SURGICAL/DEEP WOUND)
Culture: NORMAL
Gram Stain: NONE SEEN

## 2018-11-20 SURGERY — IRRIGATION AND DEBRIDEMENT WOUND
Anesthesia: General | Site: Back

## 2018-11-20 MED ORDER — FENTANYL CITRATE (PF) 250 MCG/5ML IJ SOLN
INTRAMUSCULAR | Status: DC | PRN
  Administered 2018-11-20: 100 ug via INTRAVENOUS
  Administered 2018-11-20: 50 ug via INTRAVENOUS

## 2018-11-20 MED ORDER — PROMETHAZINE HCL 25 MG/ML IJ SOLN: 6.2500 mg | INTRAMUSCULAR | Status: DC | PRN

## 2018-11-20 MED ORDER — ENOXAPARIN SODIUM 40 MG/0.4ML ~~LOC~~ SOLN
40.0000 mg | Freq: Every morning | SUBCUTANEOUS | Status: DC
  Administered 2018-11-21 – 2018-11-24 (×4): 40 mg via SUBCUTANEOUS
  Filled 2018-11-20 (×4): qty 0.4

## 2018-11-20 MED ORDER — 0.9 % SODIUM CHLORIDE (POUR BTL) OPTIME
TOPICAL | Status: DC | PRN
  Administered 2018-11-20: 1000 mL

## 2018-11-20 MED ORDER — SUCCINYLCHOLINE CHLORIDE 200 MG/10ML IV SOSY
PREFILLED_SYRINGE | INTRAVENOUS | Status: DC | PRN
  Administered 2018-11-20: 120 mg via INTRAVENOUS

## 2018-11-20 MED ORDER — PROPOFOL 10 MG/ML IV BOLUS
INTRAVENOUS | Status: DC | PRN
  Administered 2018-11-20: 200 mg via INTRAVENOUS

## 2018-11-20 MED ORDER — CELECOXIB 200 MG PO CAPS
400.0000 mg | ORAL_CAPSULE | Freq: Once | ORAL | Status: AC
  Administered 2018-11-20: 400 mg via ORAL
  Filled 2018-11-20: qty 1
  Filled 2018-11-20: qty 2

## 2018-11-20 MED ORDER — INSULIN ASPART 100 UNIT/ML ~~LOC~~ SOLN
4.0000 [IU] | Freq: Three times a day (TID) | SUBCUTANEOUS | Status: DC
  Administered 2018-11-20 – 2018-11-24 (×8): 4 [IU] via SUBCUTANEOUS

## 2018-11-20 MED ORDER — BUPIVACAINE-EPINEPHRINE (PF) 0.25% -1:200000 IJ SOLN
INTRAMUSCULAR | Status: AC
  Filled 2018-11-20: qty 30

## 2018-11-20 MED ORDER — FENTANYL CITRATE (PF) 250 MCG/5ML IJ SOLN
INTRAMUSCULAR | Status: AC
  Filled 2018-11-20: qty 5

## 2018-11-20 MED ORDER — ONDANSETRON HCL 4 MG/2ML IJ SOLN
INTRAMUSCULAR | Status: AC
  Filled 2018-11-20: qty 2

## 2018-11-20 MED ORDER — SCOPOLAMINE 1 MG/3DAYS TD PT72
1.0000 | MEDICATED_PATCH | TRANSDERMAL | Status: DC
  Administered 2018-11-20: 1.5 mg via TRANSDERMAL
  Filled 2018-11-20 (×2): qty 1

## 2018-11-20 MED ORDER — INSULIN GLARGINE 100 UNIT/ML ~~LOC~~ SOLN
30.0000 [IU] | Freq: Every morning | SUBCUTANEOUS | Status: DC
  Administered 2018-11-21: 30 [IU] via SUBCUTANEOUS
  Filled 2018-11-20 (×3): qty 0.3

## 2018-11-20 MED ORDER — CELECOXIB 200 MG PO CAPS: 400.0000 mg | ORAL_CAPSULE | Freq: Once | ORAL | Status: DC

## 2018-11-20 MED ORDER — INSULIN GLARGINE 100 UNIT/ML ~~LOC~~ SOLN: 30.0000 [IU] | Freq: Every morning | SUBCUTANEOUS | Status: DC

## 2018-11-20 MED ORDER — ONDANSETRON HCL 4 MG/2ML IJ SOLN
INTRAMUSCULAR | Status: DC | PRN
  Administered 2018-11-20: 4 mg via INTRAVENOUS

## 2018-11-20 MED ORDER — LIDOCAINE HCL (PF) 1 % IJ SOLN
INTRAMUSCULAR | Status: AC
  Filled 2018-11-20: qty 30

## 2018-11-20 MED ORDER — ACETAMINOPHEN 500 MG PO TABS: 1000.0000 mg | ORAL_TABLET | Freq: Once | ORAL | Status: DC

## 2018-11-20 MED ORDER — ACETAMINOPHEN 500 MG PO TABS
1000.0000 mg | ORAL_TABLET | Freq: Once | ORAL | Status: AC
  Administered 2018-11-20: 1000 mg via ORAL
  Filled 2018-11-20: qty 2

## 2018-11-20 MED ORDER — LIDOCAINE 2% (20 MG/ML) 5 ML SYRINGE
INTRAMUSCULAR | Status: DC | PRN
  Administered 2018-11-20: 80 mg via INTRAVENOUS

## 2018-11-20 MED ORDER — LACTATED RINGERS IV SOLN
INTRAVENOUS | Status: DC
  Administered 2018-11-20: 11:00:00 via INTRAVENOUS

## 2018-11-20 MED ORDER — FENTANYL CITRATE (PF) 100 MCG/2ML IJ SOLN: 25.0000 ug | INTRAMUSCULAR | Status: DC | PRN

## 2018-11-20 MED ORDER — PROPOFOL 10 MG/ML IV BOLUS
INTRAVENOUS | Status: AC
  Filled 2018-11-20: qty 20

## 2018-11-20 MED ORDER — MIDAZOLAM HCL 2 MG/2ML IJ SOLN
INTRAMUSCULAR | Status: DC | PRN
  Administered 2018-11-20: 2 mg via INTRAVENOUS

## 2018-11-20 MED ORDER — MIDAZOLAM HCL 2 MG/2ML IJ SOLN
INTRAMUSCULAR | Status: AC
  Filled 2018-11-20: qty 2

## 2018-11-20 MED ORDER — LIDOCAINE 2% (20 MG/ML) 5 ML SYRINGE
INTRAMUSCULAR | Status: AC
  Filled 2018-11-20: qty 5

## 2018-11-20 MED ORDER — SUCCINYLCHOLINE CHLORIDE 200 MG/10ML IV SOSY
PREFILLED_SYRINGE | INTRAVENOUS | Status: AC
  Filled 2018-11-20: qty 10

## 2018-11-20 SURGICAL SUPPLY — 45 items
BENZOIN TINCTURE PRP APPL 2/3 (GAUZE/BANDAGES/DRESSINGS) IMPLANT
BLADE CLIPPER SURG (BLADE) IMPLANT
BNDG GAUZE ELAST 4 BULKY (GAUZE/BANDAGES/DRESSINGS) ×9 IMPLANT
CANISTER SUCT 3000ML PPV (MISCELLANEOUS) ×3 IMPLANT
CLOSURE WOUND 1/2 X4 (GAUZE/BANDAGES/DRESSINGS)
COVER SURGICAL LIGHT HANDLE (MISCELLANEOUS) ×3 IMPLANT
COVER WAND RF STERILE (DRAPES) ×3 IMPLANT
DECANTER SPIKE VIAL GLASS SM (MISCELLANEOUS) IMPLANT
DRAIN PENROSE 1/2X12 LTX STRL (WOUND CARE) IMPLANT
DRAPE LAPAROTOMY TRNSV 102X78 (DRAPES) ×3 IMPLANT
ELECT REM PT RETURN 9FT ADLT (ELECTROSURGICAL) ×3
ELECTRODE REM PT RTRN 9FT ADLT (ELECTROSURGICAL) ×1 IMPLANT
GAUZE SPONGE 4X4 12PLY STRL (GAUZE/BANDAGES/DRESSINGS) ×3 IMPLANT
GAUZE SPONGE 4X4 12PLY STRL LF (GAUZE/BANDAGES/DRESSINGS) ×3 IMPLANT
GLOVE SURG SYN 7.5  E (GLOVE) ×2
GLOVE SURG SYN 7.5 E (GLOVE) ×1 IMPLANT
GOWN STRL REUS W/ TWL LRG LVL3 (GOWN DISPOSABLE) ×2 IMPLANT
GOWN STRL REUS W/ TWL XL LVL3 (GOWN DISPOSABLE) ×1 IMPLANT
GOWN STRL REUS W/TWL LRG LVL3 (GOWN DISPOSABLE) ×4
GOWN STRL REUS W/TWL XL LVL3 (GOWN DISPOSABLE) ×2
KIT BASIN OR (CUSTOM PROCEDURE TRAY) ×3 IMPLANT
KIT TURNOVER KIT B (KITS) ×3 IMPLANT
NEEDLE HYPO 25GX1X1/2 BEV (NEEDLE) IMPLANT
NS IRRIG 1000ML POUR BTL (IV SOLUTION) ×3 IMPLANT
PACK GENERAL/GYN (CUSTOM PROCEDURE TRAY) ×3 IMPLANT
PAD ABD 8X10 STRL (GAUZE/BANDAGES/DRESSINGS) ×3 IMPLANT
PAD ARMBOARD 7.5X6 YLW CONV (MISCELLANEOUS) ×6 IMPLANT
PENCIL SMOKE EVAC W/HOLSTER (ELECTROSURGICAL) ×3 IMPLANT
PENCIL SMOKE EVACUATOR (MISCELLANEOUS) ×3 IMPLANT
SPECIMEN JAR SMALL (MISCELLANEOUS) ×3 IMPLANT
SPONGE INTESTINAL PEANUT (DISPOSABLE) IMPLANT
SPONGE LAP 4X18 RFD (DISPOSABLE) IMPLANT
STAPLER VISISTAT 35W (STAPLE) IMPLANT
STRIP CLOSURE SKIN 1/2X4 (GAUZE/BANDAGES/DRESSINGS) IMPLANT
SUT CHROMIC 0 SH (SUTURE) IMPLANT
SUT CHROMIC 2 0 TIES 18 (SUTURE) IMPLANT
SUT MNCRL AB 4-0 PS2 18 (SUTURE) ×3 IMPLANT
SUT NOVA 0 T19/GS 22DT (SUTURE) IMPLANT
SUT VIC AB 2-0 SH 27 (SUTURE)
SUT VIC AB 2-0 SH 27X BRD (SUTURE) IMPLANT
SUT VIC AB 3-0 SH 18 (SUTURE) ×6 IMPLANT
SYR CONTROL 10ML LL (SYRINGE) IMPLANT
TAPE CLOTH SURG 6X10 WHT LF (GAUZE/BANDAGES/DRESSINGS) ×3 IMPLANT
TOWEL GREEN STERILE (TOWEL DISPOSABLE) ×3 IMPLANT
TOWEL GREEN STERILE FF (TOWEL DISPOSABLE) IMPLANT

## 2018-11-20 NOTE — Anesthesia Preprocedure Evaluation (Addendum)
Anesthesia Evaluation  Patient identified by MRN, date of birth, ID band Patient awake    Reviewed: Allergy & Precautions, H&P , NPO status , Patient's Chart, lab work & pertinent test results  History of Anesthesia Complications Negative for: history of anesthetic complications  Airway Mallampati: II  TM Distance: >3 FB Neck ROM: Full    Dental no notable dental hx. (+) Dental Advisory Given   Pulmonary neg pulmonary ROS,    Pulmonary exam normal        Cardiovascular hypertension, Normal cardiovascular exam     Neuro/Psych negative neurological ROS  negative psych ROS   GI/Hepatic negative GI ROS, Neg liver ROS,   Endo/Other  diabetes, Poorly Controlled, Type 2  Renal/GU negative Renal ROS  negative genitourinary   Musculoskeletal  (+) Arthritis ,   Abdominal   Peds  Hematology   Anesthesia Other Findings   Reproductive/Obstetrics                            Anesthesia Physical  Anesthesia Plan  ASA: II  Anesthesia Plan: General   Post-op Pain Management:    Induction: Intravenous  PONV Risk Score and Plan: 2 and Ondansetron, Dexamethasone, Midazolam and Treatment may vary due to age or medical condition  Airway Management Planned: Oral ETT  Additional Equipment:   Intra-op Plan:   Post-operative Plan: Extubation in OR  Informed Consent: I have reviewed the patients History and Physical, chart, labs and discussed the procedure including the risks, benefits and alternatives for the proposed anesthesia with the patient or authorized representative who has indicated his/her understanding and acceptance.     Dental advisory given  Plan Discussed with: Anesthesiologist, CRNA and Surgeon  Anesthesia Plan Comments:        Anesthesia Quick Evaluation

## 2018-11-20 NOTE — Progress Notes (Addendum)
  Subjective:  Patient reports that he is feeling well today, has occasional back pain.  Patient denies fever, chills, shortness of breath.  He reports he had episode of hiccups yesterday but they went away after the baclofen.  Objective:    Vital Signs (last 24 hours): Vitals:   11/19/18 0304 11/19/18 1215 11/19/18 2010 11/20/18 0400  BP: 129/87 136/78 (!) 148/70 117/74  Pulse: 82 89 80 75  Resp: 12 14 18 17   Temp: 98.1 F (36.7 C)  98 F (36.7 C) 97.9 F (36.6 C)  TempSrc: Oral Oral Oral Oral  SpO2: 96%  96% 98%  Weight:      Height:        Physical Exam: General Alert and answers questions appropriately, no acute distress  Cardiac Regular rate and rhythm, no murmurs, rubs, or gallops  Pulmonary Clear to auscultation bilaterally without wheezes, rhonchi, or rales  Extremities No peripheral edema    Assessment/Plan:   Principal Problem:   Abscess of back Active Problems:   DKA (diabetic ketoacidoses) (HCC)   Hypertension   Sepsis ([place])   Pressure injury of skin   Enteritis  Patient is a 58 year old male with past medical history notable for poorly controlled diabetes who presented with a 3 to 4-week history of back pain and swelling.  Patient is postop day #6 after I&D by general surgery with plans for repeat I&D today.  Patient's mild DKA at time admission resolved rapidly and patient is now on subcu insulin with tight glucose control to promote wound healing.  # Back abscess complicated by sepsis: Patient continues to improve.  Afebrile, white count of 10.3, day 7 of antibiotics.  Plan is for repeat I&D today by general surgery.  Wound culture from prior I&D shows few normal skin flora.  Blood cultures and 1/4 bottles grows Turicella Otitidis on culture - likely contaminant given species and 1 out of 4 cultures. * Continue vancomycin and ceftriaxone (day 7)  # T2DM: Aggressive glucose control to promote wound healing with goal less than 150.  Elevated glucose yesterday  at noon after patient not receiving a.m. mealtime insulin.  However, readings overnight and this morning are at/near goal. * Continue regimen with Lantus 30 units + 6 units mealtime insulin + sliding scale insulin.  Diet: Carb modified DVT Ppx: Will hold Lovenox 40 mg today and place on SCDs for repeat I&D Dispo: Anticipated discharge pending clinical improvement.   Jeanmarie Hubert, MD 11/20/2018, 10:23 AM Pager: [phone]

## 2018-11-20 NOTE — Progress Notes (Signed)
Pharmacy Antibiotic Note  Drew Evans. is a 58 y.o. male admitted on 11/14/2018 with Sepsis with Huge abscess to his back.  Pharmacy has been consulted for Vancomycin dosing.  ID: Sepsis with Huge abscess to his back, previously on bactrim 800-160 mg BID. WBC decreasing 13>>10.3, Scr WNL. Afebrile - Wound I&D 9/10, 9/16  Unusual organism in isolated in [facility]. There are several case reports available pertaining to bacteremia, several in children, several in adult. [place] case report published 04/19/2018 on bacteremia in a cancer patient where is was "possibly" sensitive to vancomycin, polymyxin B, and chloramphenicol. They used Vanco.   Biomedical Researt 2017: Coryneform bacteria (with the exception of Corynebacterium diphtheria), is considered to be part of thenormal flora of the skin.  It was  found, in  the last twenty years, to  be  involved in human  pathology  byidentifying  pathogenic  species  such  as Turicella  otitidis.. It is highly susceptible  to  beta-lactam:  penicillins,  cephalopsorins,carbapenems  [5],  as  well  as  to  chloramphenicol,  linezolid,vancomycin,  teicoplanin;  it  is  considered  to  have  a  naturalresistance  to  aztreonam,  cotrimoxazole,  nitrofurantoin,fosfomycin,  and  a  high  resistance  to  macrolides  And clindamycin.    Zosyn 9/10 >> 9/12 Ceftriaxone 9/12 >> Vancomycin 9/10 >>  9/10 Bcx: TURICELLA OTITIDIS  9/10 Wound culture: abundant Gram + rods, moderate gram + cocci, few gram - rods. Few normal skin flora. 9/10 Ucx: neg  Plan: Vancomycin 1250 mg IV q12h (est AUC 470, Scr 0.8) Ceftriaxone 2g IV q 24h Plan Vancomycin levels tomorrow Take back to the OR today F/u for long-term abx plan    Height: 6' (182.9 cm) Weight: 246 lb 14.6 oz (112 kg) IBW/kg (Calculated) : 77.6  Temp (24hrs), Avg:98 F (36.7 C), Min:97.9 F (36.6 C), Max:98 F (36.7 C)  Recent Labs  Lab 11/14/18 1328  11/14/18 2050  11/16/18 0317 11/17/18 0308  11/18/18 0549 11/19/18 0316 11/20/18 0312  WBC  --   --   --    < > 16.6* 13.1* 13.3* 10.3 10.3  CREATININE  --    < >  --    < > 0.76 0.68 0.62 0.63 0.65  LATICACIDVEN 1.9  --  1.5  --   --   --   --   --   --    < > = values in this interval not displayed.    Estimated Creatinine Clearance: 128.5 mL/min (by C-G formula based on SCr of 0.65 mg/dL).    Allergies  Allergen Reactions  . No Known Allergies      Drew Evans S. Alford Highland, PharmD, BCPS Clinical Staff Pharmacist Eilene Ghazi Stillinger 11/20/2018 10:12 AM

## 2018-11-20 NOTE — Progress Notes (Signed)
For repeat debridement today.  Questions answered.  He does have two steel rods in his back for spinal issues.  Alphonsa Overall, MD, [facility] Pager: [phone] Office phone:  [phone]

## 2018-11-20 NOTE — Transfer of Care (Signed)
Immediate Anesthesia Transfer of Care Note  Patient: Drew Evans.  Procedure(s) Performed: IRRIGATION AND DEBRIDEMENT BACK WOUND (N/A Back)  Patient Location: PACU  Anesthesia Type:General  Level of Consciousness: awake, alert  and oriented  Airway & Oxygen Therapy: Patient Spontanous Breathing and Patient connected to face mask oxygen  Post-op Assessment: Report given to RN and Post -op Vital signs reviewed and stable  Post vital signs: Reviewed and stable  Last Vitals:  Vitals Value Taken Time  BP 117/69 11/20/18 1349  Temp    Pulse 99 11/20/18 1351  Resp 13 11/20/18 1351  SpO2 96 % 11/20/18 1351  Vitals shown include unvalidated device data.  Last Pain:  Vitals:   11/20/18 0809  TempSrc:   PainSc: 0-No pain      Patients Stated Pain Goal: 0 (02/54/27 0623)  Complications: No apparent anesthesia complications

## 2018-11-20 NOTE — Op Note (Signed)
11/20/2018  1:42 PM  PATIENT:  Drew Hora., 58 y.o., male, MRN: [id number]  PREOP DIAGNOSIS:  Back wound  POSTOP DIAGNOSIS:   Back wound  PROCEDURE:   Procedure(s): IRRIGATION AND DEBRIDEMENT BACK WOUND  SURGEON:   Alphonsa Overall, M.D.  ASSISTANT:   None  ANESTHESIA:   General  Anesthesiologist: Duane Boston, MD CRNA: Alain Marion, CRNA  General  EBL:  100  ml  BLOOD ADMINISTERED: none  DRAINS: none   LOCAL MEDICATIONS USED:  None  SPECIMEN:   None  COUNTS CORRECT:  YES  INDICATIONS FOR PROCEDURE:  Drew Hora. is a 58 y.o. (DOB: May 15, 1960) white male whose primary care physician is Patient, No Pcp Per and comes for I&D of back wound/abscess.   The indications and risks of the surgery were explained to the patient.  The risks include, but are not limited to, infection, bleeding, and nerve injury.  PROCEDURE: The patient was taken to room #1 at [facility].  He underwent a general anesthetic.  He was placed in the left lateral decubitus position on a bean bag.  A timeout was held and the surgical checklist run.  His back was prepped with Betadine solution.  The wound was sterilely draped.  He had an open wound approximately 6 x 7 cm.  The wound tracked to the left crossing the midline.  I opened the wound towards the left about 6 cm.  So his wound was now approximately 7 x 12 cm.  I debrided the subcutaneous tissue and muscle sharply with scissors and cautery.  About 80% of the wound had reasonable granulation tissue, so the wound was in reasonable shape..  I then irrigated the wound with 1 L of saline.  I packed the wound with two 4 inch Kerlix gauze damp with saline.  The wound was sterilely dressed with 4 x 4's and ABD.  The patient was transported to the recovery room in good condition.  Excisional debridement:  1.  Tool used for debridement (curette, scapel, etc.)  scapel and cautery  2.  Frequency of surgical debridement.   today  3.   Measurement of total devitalized tissue (wound surface) after surgical debridement.   7 cm x 14 cm  4.  Area and depth of devitalized tissue removed from wound.  6 cm x 6 cm  5.  Description of tissue removed.  Dead tissue  6.  Progress note or procedure note with a detailed description of the procedure.  7.  Evidence of the progress of the wound's response to treatment.  A.  Current wound volume.  7 cm x 14 cm  B.  Presence of infection.  Yes  C.  Presence of non viable tissue.  Yes  Alphonsa Overall, MD, [facility] Pager: [phone] Office phone:  [phone]

## 2018-11-20 NOTE — Anesthesia Postprocedure Evaluation (Signed)
Anesthesia Post Note  Patient: Drew Evans.  Procedure(s) Performed: IRRIGATION AND DEBRIDEMENT BACK WOUND (N/A Back)     Patient location during evaluation: PACU Anesthesia Type: General Level of consciousness: sedated Pain management: pain level controlled Vital Signs Assessment: post-procedure vital signs reviewed and stable Respiratory status: spontaneous breathing and respiratory function stable Cardiovascular status: stable Postop Assessment: no apparent nausea or vomiting Anesthetic complications: no    Last Vitals:  Vitals:   11/20/18 1418 11/20/18 1434  BP: 116/65 116/64  Pulse: 75 67  Resp: 15 14  Temp:  36.8 C  SpO2: 95% 95%    Last Pain:  Vitals:   11/20/18 1418  TempSrc:   PainSc: 0-No pain                 Rheba Diamond DANIEL

## 2018-11-20 NOTE — Anesthesia Procedure Notes (Signed)
Procedure Name: Intubation Date/Time: 11/20/2018 12:56 PM Performed by: Alain Marion, CRNA Pre-anesthesia Checklist: Patient identified, Emergency Drugs available, Suction available and Patient being monitored Patient Re-evaluated:Patient Re-evaluated prior to induction Oxygen Delivery Method: Circle System Utilized Preoxygenation: Pre-oxygenation with 100% oxygen Induction Type: IV induction and Rapid sequence Laryngoscope Size: Miller and 2 Grade View: Grade III Tube type: Oral Tube size: 7.5 mm Number of attempts: 1 Airway Equipment and Method: Stylet Placement Confirmation: ETT inserted through vocal cords under direct vision,  positive ETCO2 and breath sounds checked- equal and bilateral Secured at: 23 cm Tube secured with: Tape Dental Injury: Teeth and Oropharynx as per pre-operative assessment

## 2018-11-21 LAB — VANCOMYCIN, TROUGH: Vancomycin Tr: 12 ug/mL — ABNORMAL LOW (ref 15–20)

## 2018-11-21 LAB — CBC
HCT: 31.5 % — ABNORMAL LOW (ref 39.0–52.0)
Hemoglobin: 10.5 g/dL — ABNORMAL LOW (ref 13.0–17.0)
MCH: 28.3 pg (ref 26.0–34.0)
MCHC: 33.3 g/dL (ref 30.0–36.0)
MCV: 84.9 fL (ref 80.0–100.0)
Platelets: 378 10*3/uL (ref 150–400)
RBC: 3.71 MIL/uL — ABNORMAL LOW (ref 4.22–5.81)
RDW: 13.2 % (ref 11.5–15.5)
WBC: 7.5 10*3/uL (ref 4.0–10.5)
nRBC: 0 % (ref 0.0–0.2)

## 2018-11-21 LAB — BASIC METABOLIC PANEL
Anion gap: 10 (ref 5–15)
BUN: 6 mg/dL (ref 6–20)
CO2: 26 mmol/L (ref 22–32)
Calcium: 8 mg/dL — ABNORMAL LOW (ref 8.9–10.3)
Chloride: 100 mmol/L (ref 98–111)
Creatinine, Ser: 0.72 mg/dL (ref 0.61–1.24)
GFR calc Af Amer: >60 mL/min (ref >60)
GFR calc non Af Amer: >60 mL/min (ref >60)
Glucose, Bld: 146 mg/dL — ABNORMAL HIGH (ref 70–99)
Potassium: 4.1 mmol/L (ref 3.5–5.1)
Sodium: 136 mmol/L (ref 135–145)

## 2018-11-21 LAB — GLUCOSE, CAPILLARY
Glucose-Capillary: 143 mg/dL — ABNORMAL HIGH (ref 70–99)
Glucose-Capillary: 184 mg/dL — ABNORMAL HIGH (ref 70–99)
Glucose-Capillary: 146 mg/dL — ABNORMAL HIGH (ref 70–99)
Glucose-Capillary: 115 mg/dL — ABNORMAL HIGH (ref 70–99)

## 2018-11-21 LAB — VANCOMYCIN, PEAK: Vancomycin Pk: 24 ug/mL — ABNORMAL LOW (ref 30–40)

## 2018-11-21 MED ORDER — VANCOMYCIN HCL 10 G IV SOLR
1500.0000 mg | Freq: Two times a day (BID) | INTRAVENOUS | Status: DC
  Administered 2018-11-21 – 2018-11-24 (×6): 1500 mg via INTRAVENOUS
  Filled 2018-11-21 (×8): qty 1500

## 2018-11-21 MED ORDER — ACETAMINOPHEN 325 MG PO TABS
650.0000 mg | ORAL_TABLET | Freq: Four times a day (QID) | ORAL | Status: DC | PRN
  Administered 2018-11-21: 650 mg via ORAL
  Filled 2018-11-21: qty 2

## 2018-11-21 NOTE — Progress Notes (Addendum)
  Subjective:   Patient reports he is doing about the same today.  Denies shortness of breath, fever, chills.  Patient feels some pain from his back when the wound is manipulated but thinks the oxycodone is sufficiently covering the pain.  Patient updated on plan of care, all questions answered.  Objective:   Vital Signs (last 24 hours): Vitals:   11/20/18 1418 11/20/18 1434 11/20/18 2137 11/21/18 0629  BP: 116/65 116/64 130/70 121/68  Pulse: 75 67 68 65  Resp: 15 14 16 15   Temp:  98.3 F (36.8 C) 97.6 F (36.4 C) 98.1 F (36.7 C)  TempSrc:   Oral Oral  SpO2: 95% 95% 100% 95%  Weight:      Height:        Physical Exam: General Alert and answers questions appropriately, no acute distress  Cardiac Regular rate and rhythm, no murmurs, rubs, or gallops  Pulmonary Clear to auscultation bilaterally without wheezes, rhonchi, or rales  Skin Surgical wound without significant purulence, drainage  Extremities No peripheral edema    Assessment/Plan:   Principal Problem:   Abscess of back Active Problems:   DKA (diabetic ketoacidoses) (HCC)   Hypertension   Sepsis ([place])   Pressure injury of skin   Enteritis  Patient is a 58 year old male with past medical history notable for poorly controlled diabetes who presented with a 3 to 4-week history of back pain and swelling.  Patient is postop day #7 after initial I&D and postop day #1 after repeat I&D.  # Back abscess complicated by sepsis: Patient continues to improve.  Afebrile, white count of 7.5 today.  Repeat I&D yesterday.  Wound cultures from initial I&D showed few normal skin flora, likely contaminant and blood cultures in 1/4 bottles grew Turicella Otitidis which is also likely a contaminant. * On day 8 of vancomycin and ceftriaxone, will switch to oral antibiotics tomorrow with doxycycline * Patient's aunt is a retired Haematologist and lives nearby and can help with dressing changes.  Patient to receive training on dressing changes  per general surgery.  # T2DM: Goal glucose less than 150.  Patient had good glucose control with max of 159 yesterday.  Elevated reading of 184 at 11 AM today, however patient did not receive long-acting insulin until about 1 PM.  Diet: Carb Modified DVT Ppx:  Dispo: Anticipated discharge tomorrow  Jeanmarie Hubert, MD 11/21/2018, 1:41 PM Pager: [phone]

## 2018-11-21 NOTE — TOC Initial Note (Addendum)
Transition of Care (TOC) - Initial/Assessment Note  Marvetta Gibbons RN, BSN Transitions of Care Unit 4E- RN Case Manager [phone]   Patient Details  Name: Drew Evans. MRN: [id number] Date of Birth: 24-Dec-1960  Transition of Care [facility]) CM/SW Contact:    Dawayne Patricia, RN Phone Number: 11/21/2018, 2:05 PM  Clinical Narrative:                 Pt admitted with back abscess s/p I&D, referral for [facility] needs received- pt will need HHRN orders placed with F2F CM spoke with pt and aunt at bedside regarding transition of care needs- address and phone # confirmed. Pt expressed that he currently does not have a PCP and is working on getting new one, states he spoke with rounding team about f/u with them but also has another provider he is thinking about- explained to pt that [facility] would need a primary care doctor in order to provide services and have someone to call for any needs. Pt's aunt will assist pt with wound care at home. List provided pt for [facility] agency choice Per CMS guidelines from medicare.gov website with star ratings (copy placed in shadow chart)- pt request time to review and research list- CM will f/u in am regarding choice- pt declines any DME needs.    Expected Discharge Plan: [place] Barriers to Discharge: Continued Medical Work up   Patient Goals and CMS Choice Patient states their goals for this hospitalization and ongoing recovery are:: return home and healing CMS Medicare.gov Compare Post Acute Care list provided to:: Patient Choice offered to / list presented to : Patient  Expected Discharge Plan and Services Expected Discharge Plan: [place]   Discharge Planning Services: CM Consult Post Acute Care Choice: [place] arrangements for the past 2 months: Single Family Home                 DME Arranged: N/A DME Agency: NA       HH Arranged: RN          Prior Living Arrangements/Services Living  arrangements for the past 2 months: Single Family Home Lives with:: Self Patient language and need for interpreter reviewed:: Yes Do you feel safe going back to the place where you live?: Yes      Need for Family Participation in Patient Care: Yes (Comment)     Criminal Activity/Legal Involvement Pertinent to Current Situation/Hospitalization: No - Comment as needed  Activities of Daily Living Home Assistive Devices/Equipment: None ADL Screening (condition at time of admission) Patient's cognitive ability adequate to safely complete daily activities?: Yes Is the patient deaf or have difficulty hearing?: No Does the patient have difficulty seeing, even when wearing glasses/contacts?: No Does the patient have difficulty concentrating, remembering, or making decisions?: No Patient able to express need for assistance with ADLs?: Yes Does the patient have difficulty dressing or bathing?: No Independently performs ADLs?: Yes (appropriate for developmental age) Does the patient have difficulty walking or climbing stairs?: No Weakness of Legs: None Weakness of Arms/Hands: None  Permission Sought/Granted Permission sought to share information with : Facility Sport and exercise psychologist, Case Manager, Family Supports    Share Information with NAME: Everette Rank  Permission granted to share info w AGENCY: [facility] agency  Permission granted to share info w Relationship: aunt  Permission granted to share info w Contact Information: [phone]  Emotional Assessment Appearance:: Appears stated age Attitude/Demeanor/Rapport: Engaged Affect (typically observed): Appropriate, Pleasant Orientation: :  Oriented to Self, Oriented to Place, Oriented to Situation, Oriented to  Time   Psych Involvement: No (comment)  Admission diagnosis:  Cutaneous abscess of back excluding buttocks [L02.212] Diabetic ketoacidosis without coma associated with type 2 diabetes mellitus (HCC) [E11.10] Sepsis without acute organ  dysfunction, due to unspecified organism [facility](HCC) [A41.9] Patient Active Problem List   Diagnosis Date Noted  . Enteritis   . Pressure injury of skin 11/15/2018  . Abscess of back 11/14/2018  . Sepsis (HCC) 11/14/2018  . Hypertension   . 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 DKA (diabetic ketoacidoses) (HCC) 02/08/2016  . Obesity 02/08/2016   PCP:  Patient, No Pcp Per Pharmacy:   [id number] - [place], [place] - [address] [place] [id number] Phone: [phone] Fax: [phone]     Social Determinants of Health (SDOH) Interventions    Readmission Risk Interventions No flowsheet data found.

## 2018-11-21 NOTE — Progress Notes (Addendum)
1 Day Post-Op  Subjective: CC: Doing well. Tolerating dressing changes with oral pain medication. No new complaints.   Objective: Vital signs in last 24 hours: Temp:  [97.6 F (36.4 C)-98.3 F (36.8 C)] 98.1 F (36.7 C) (09/17 0629) Pulse Rate:  [65-82] 65 (09/17 0629) Resp:  [12-16] 15 (09/17 0629) BP: (116-130)/(64-70) 121/68 (09/17 0629) SpO2:  [95 %-100 %] 95 % (09/17 0629) Weight:  [161[112 kg] 112 kg (09/16 1053) Last BM Date: 11/16/18  Intake/Output from previous day: 09/16 0701 - 09/17 0700 In: 635.7 [I.V.:635.7] Out: 805 [Urine:800; Blood:5] Intake/Output this shift: Total I/O In: -  Out: 350 [Urine:350]  PE: Gen: Alert, NAD, pleasant.  Pulm:Normal rate and effort Ext: No LE edema Psych: A&Ox3  Skin:See picture below. Patient with 12x16cm area of dark red, blanchable skin surrounding incision. Incision base is clean with muscle belly, some healthy granulation tissue and some fibrinous material inferolaterally. There was some sloughing of fibrous tissue on packing when it was removed. No gross purulence. No gross purulence with probing. The wound tracks a few cm's medially and superiorly. Overall the wound appears clean.       Lab Results:  Recent Labs    11/20/18 0312 11/21/18 0626  WBC 10.3 7.5  HGB 11.1* 10.5*  HCT 32.3* 31.5*  PLT 336 378   BMET Recent Labs    11/20/18 0312 11/21/18 0626  NA 136 136  K 3.8 4.1  CL 101 100  CO2 25 26  GLUCOSE 131* 146*  BUN 7 6  CREATININE 0.65 0.72  CALCIUM 8.2* 8.0*   PT/INR No results for input(s): LABPROT, INR in the last 72 hours. CMP     Component Value Date/Time   NA 136 11/21/2018 0626   K 4.1 11/21/2018 0626   CL 100 11/21/2018 0626   CO2 26 11/21/2018 0626   GLUCOSE 146 (H) 11/21/2018 0626   BUN 6 11/21/2018 0626   CREATININE 0.72 11/21/2018 0626   CALCIUM 8.0 (L) 11/21/2018 0626   PROT 4.6 (L) 11/15/2018 0238   ALBUMIN 1.6 (L) 11/15/2018 0238   AST 17 11/15/2018 0238   ALT 19  11/15/2018 0238   ALKPHOS 123 11/15/2018 0238   BILITOT 0.5 11/15/2018 0238   GFRNONAA >60 11/21/2018 0626   GFRAA >60 11/21/2018 0626   Lipase  No results found for: LIPASE     Studies/Results: No results found.  Anti-infectives: Anti-infectives (From admission, onward)   Start     Dose/Rate Route Frequency Ordered Stop   11/17/18 1500  cefTRIAXone (ROCEPHIN) 2 g in sodium chloride 0.9 % 100 mL IVPB     2 g 200 mL/hr over 30 Minutes Intravenous Every 24 hours 11/17/18 1049     11/15/18 1500  vancomycin (VANCOCIN) 1,250 mg in sodium chloride 0.9 % 250 mL IVPB     1,250 mg 166.7 mL/hr over 90 Minutes Intravenous Every 12 hours 11/15/18 1030     11/15/18 0230  vancomycin (VANCOCIN) 1,250 mg in sodium chloride 0.9 % 250 mL IVPB  Status:  Discontinued     1,250 mg 166.7 mL/hr over 90 Minutes Intravenous Every 12 hours 11/14/18 1433 11/14/18 2344   11/15/18 0000  piperacillin-tazobactam (ZOSYN) IVPB 3.375 g  Status:  Discontinued     3.375 g 12.5 mL/hr over 240 Minutes Intravenous Every 8 hours 11/14/18 2359 11/17/18 1049   11/15/18 0000  vancomycin (VANCOCIN) 1,250 mg in sodium chloride 0.9 % 250 mL IVPB     1,250 mg 166.7 mL/hr  over 90 Minutes Intravenous 2 times daily 11/14/18 2359 11/15/18 0332   11/14/18 2000  piperacillin-tazobactam (ZOSYN) IVPB 3.375 g  Status:  Discontinued     3.375 g 12.5 mL/hr over 240 Minutes Intravenous Every 8 hours 11/14/18 1433 11/14/18 2344   11/14/18 1345  vancomycin (VANCOCIN) 2,500 mg in sodium chloride 0.9 % 500 mL IVPB     2,500 mg 250 mL/hr over 120 Minutes Intravenous  Once 11/14/18 1344 11/14/18 1622   11/14/18 1345  piperacillin-tazobactam (ZOSYN) IVPB 3.375 g     3.375 g 100 mL/hr over 30 Minutes Intravenous  Once 11/14/18 1344 11/14/18 1441       Assessment/Plan DM who presented in DKA  Back abscess - S/p I&D INCISION,DRAINAGEANDEXCISIONALDEBRIDEMENTOF SKIN, SOFT TISSUE & MUSCLE OF RIGHTBACK ABSCESS- Dr.  Redmond Pulling - 9/10   - S/p IRRIGATION AND DEBRIDEMENT BACK WOUND - Dr. Lucia Gaskins - 9/16 - POD # 6 & 1 - Cx w/ normal skin flora - WBC normalized - Okay to transition to oral abx. Will need a 14 day course total  - Will start arranging wound care at home. See below.   FEN -CM.   VTE -SCDs, Lovenox ID -Rocephin/Vanc  Plan: Patient can be transitioned to oral abx. Will need a total of a 14 day course. He is tolerating dressing changes with oral pain medication. He lives at home with his 60 year old son. He does not feel his son will be able to help with dressing changes.   His Aunt, Fraser Din, is a retired Haematologist and lives 5 minutes away from him. I spoke with her on speaker phone in the room. She feels she will be able to help him with dressing changes at home. She is going to come in today to be taught how to do his dressing change. I will also reach out to case management to see if he can have some assistance at home with dressing changes. He is moving towards being able to be discharged from our standpoint.    LOS: 7 days    Jillyn Ledger , [facility] 11/21/2018, 10:24 AM Pager: [phone]  Agree with above.  Legrand Como went over with his Aunt about changing the dressing.   Otherwise doing well from our standpoint.  Alphonsa Overall, MD, [facility] Pager: [phone] Office phone:  [phone]

## 2018-11-21 NOTE — Care Management Important Message (Signed)
Important Message  Patient Details  Name: Drew Evans. MRN: [id number] Date of Birth: 1960/12/31   Medicare Important Message Given:  Yes     Shelda Altes 11/21/2018, 2:02 PM

## 2018-11-21 NOTE — Progress Notes (Signed)
Dressing changed @2230 . Clean, dry and intact

## 2018-11-21 NOTE — Progress Notes (Signed)
Pharmacy Antibiotic Note  Drew Evans. is a 58 y.o. male admitted on 11/14/2018 with sepsis 2/2 abscess. Pharmacy has been consulted for vancomycin dosing. Vancomycin levels demonstrate slightly subtherapeutic AUC, renal function has remained stable.  Plan: -Increase vancomycin to 1500mg  IV q12h - est AUC 508  Height: 6' (182.9 cm) Weight: 246 lb 14.6 oz (112 kg) IBW/kg (Calculated) : 77.6  Temp (24hrs), Avg:97.9 F (36.6 C), Min:97.6 F (36.4 C), Max:98.1 F (36.7 C)  Recent Labs  Lab 11/14/18 2050  11/17/18 0308 11/18/18 0549 11/19/18 0316 11/20/18 0312 11/21/18 0626 11/21/18 1518  WBC  --    < > 13.1* 13.3* 10.3 10.3 7.5  --   CREATININE  --    < > 0.68 0.62 0.63 0.65 0.72  --   LATICACIDVEN 1.5  --   --   --   --   --   --   --   VANCOTROUGH  --   --   --   --   --   --   --  12*  VANCOPEAK  --   --   --   --   --   --  24*  --    < > = values in this interval not displayed.    Estimated Creatinine Clearance: 128.5 mL/min (by C-G formula based on SCr of 0.72 mg/dL).    Allergies  Allergen Reactions  . No Known Allergies     Antimicrobials this admission: Zosyn 9/10 >> 9/12 Ceftriaxone 9/12 >> Vancomycin 9/10 >>  Microbiology:  9/10Bcx: TURICELLA OTITIDIS  9/10 Wound culture: abundant Gram + rods, moderate gram + cocci, few gram - rods. Few normal skin flora. 9/10 Ucx: neg  Thank you for allowing pharmacy to be a part of this patient's care.   Arrie Senate, PharmD, BCPS Clinical Pharmacist [phone] Please check AMION for all [place] numbers 11/21/2018

## 2018-11-21 NOTE — Plan of Care (Signed)
  Problem: Clinical Measurements: Goal: Respiratory complications will improve Outcome: Progressing Goal: Cardiovascular complication will be avoided Outcome: Progressing   

## 2018-11-22 ENCOUNTER — Encounter (HOSPITAL_COMMUNITY): Payer: Self-pay | Admitting: Surgery

## 2018-11-22 LAB — BASIC METABOLIC PANEL
Anion gap: 9 (ref 5–15)
BUN: 6 mg/dL (ref 6–20)
CO2: 28 mmol/L (ref 22–32)
Calcium: 8.3 mg/dL — ABNORMAL LOW (ref 8.9–10.3)
Chloride: 100 mmol/L (ref 98–111)
Creatinine, Ser: 0.79 mg/dL (ref 0.61–1.24)
GFR calc Af Amer: >60 mL/min (ref >60)
GFR calc non Af Amer: >60 mL/min (ref >60)
Glucose, Bld: 116 mg/dL — ABNORMAL HIGH (ref 70–99)
Potassium: 4.1 mmol/L (ref 3.5–5.1)
Sodium: 137 mmol/L (ref 135–145)

## 2018-11-22 LAB — CBC
HCT: 31.2 % — ABNORMAL LOW (ref 39.0–52.0)
Hemoglobin: 10.4 g/dL — ABNORMAL LOW (ref 13.0–17.0)
MCH: 28.3 pg (ref 26.0–34.0)
MCHC: 33.3 g/dL (ref 30.0–36.0)
MCV: 85 fL (ref 80.0–100.0)
Platelets: 431 10*3/uL — ABNORMAL HIGH (ref 150–400)
RBC: 3.67 MIL/uL — ABNORMAL LOW (ref 4.22–5.81)
RDW: 13.1 % (ref 11.5–15.5)
WBC: 7.7 10*3/uL (ref 4.0–10.5)
nRBC: 0 % (ref 0.0–0.2)

## 2018-11-22 LAB — GLUCOSE, CAPILLARY
Glucose-Capillary: 115 mg/dL — ABNORMAL HIGH (ref 70–99)
Glucose-Capillary: 139 mg/dL — ABNORMAL HIGH (ref 70–99)
Glucose-Capillary: 148 mg/dL — ABNORMAL HIGH (ref 70–99)
Glucose-Capillary: 179 mg/dL — ABNORMAL HIGH (ref 70–99)

## 2018-11-22 MED ORDER — INSULIN GLARGINE 100 UNIT/ML SOLOSTAR PEN: 22.0000 [IU] | PEN_INJECTOR | Freq: Every day | SUBCUTANEOUS | 1 refills | Status: DC

## 2018-11-22 MED ORDER — DOXYCYCLINE HYCLATE 100 MG PO TABS: 100.0000 mg | ORAL_TABLET | Freq: Two times a day (BID) | ORAL | 0 refills | Status: DC

## 2018-11-22 MED ORDER — BLOOD GLUCOSE MONITOR KIT: PACK | 0 refills | Status: AC

## 2018-11-22 MED ORDER — INSULIN LISPRO (1 UNIT DIAL) 100 UNIT/ML (KWIKPEN): 4.0000 [IU] | PEN_INJECTOR | Freq: Three times a day (TID) | SUBCUTANEOUS | 0 refills | Status: DC

## 2018-11-22 MED ORDER — METFORMIN HCL 500 MG PO TABS: 500.0000 mg | ORAL_TABLET | Freq: Two times a day (BID) | ORAL | 2 refills | Status: DC

## 2018-11-22 MED ORDER — CANAGLIFLOZIN 300 MG PO TABS: 300.0000 mg | ORAL_TABLET | Freq: Every day | ORAL | 1 refills | Status: DC

## 2018-11-22 NOTE — Progress Notes (Signed)
[facility] Office:  [phone] General Surgery Progress Note   LOS: 8 days  POD -  2 Days Post-Op  Chief Complaint: Back abscess  Assessment and Plan: 1.  Back abscess   I&D INCISION,DRAINAGEANDEXCISIONALDEBRIDEMENTOF SKIN, SOFT TISSUE & MUSCLE OF RIGHTBACK ABSCESS- Dr. Andrey CampanileWilson - 9/10                   IRRIGATION AND DEBRIDEMENT BACK WOUND - Dr. Ezzard StandingNewman - 9/16  On Rocephin and Vanc - okay to transition to oral antibiotics  He has an aunt who is going to help him at home care for this  He should follow up in our office with Dr. Andrey CampanileWilson or Ezzard StandingNewman in 1 to 2 weeks after discharge.  2.  DM who presented in DKA  Glucose - 115 - 11/22/2018 3.  DVT prophylaxis - Lovenox   Principal Problem:   Abscess of back Active Problems:   DKA (diabetic ketoacidoses) (HCC)   Hypertension   Sepsis (HCC)   Pressure injury of skin   Enteritis  Subjective:  Alert and doing well  Objective:   Vitals:   11/21/18 2100 11/22/18 0430  BP:  113/68  Pulse:  69  Resp: 17 18  Temp: 98.2 F (36.8 C) 97.8 F (36.6 C)  SpO2:  94%     Intake/Output from previous day:  09/17 0701 - 09/18 0700 In: 1076.9 [P.O.:880; IV Piggyback:196.9] Out: 1625 [Urine:1625]  Intake/Output this shift:  No intake/output data recorded.   Physical Exam:   General: WN WM who is alert and oriented.    HEENT: Normal. Pupils equal. .   Wound: Large open back wound (photos in chart)   Lab Results:    Recent Labs    11/21/18 0626 11/22/18 0246  WBC 7.5 7.7  HGB 10.5* 10.4*  HCT 31.5* 31.2*  PLT 378 431*    BMET   Recent Labs    11/21/18 0626 11/22/18 0246  NA 136 137  K 4.1 4.1  CL 100 100  CO2 26 28  GLUCOSE 146* 116*  BUN 6 6  CREATININE 0.72 0.79  CALCIUM 8.0* 8.3*    PT/INR  No results for input(s): LABPROT, INR in the last 72 hours.  ABG  No results for input(s): PHART, HCO3 in the last 72 hours.  Invalid input(s): PCO2, PO2   Studies/Results:  No results  found.   Anti-infectives:   Anti-infectives (From admission, onward)   Start     Dose/Rate Route Frequency Ordered Stop   11/21/18 1630  vancomycin (VANCOCIN) 1,500 mg in sodium chloride 0.9 % 500 mL IVPB     1,500 mg 250 mL/hr over 120 Minutes Intravenous Every 12 hours 11/21/18 1615     11/17/18 1500  cefTRIAXone (ROCEPHIN) 2 g in sodium chloride 0.9 % 100 mL IVPB     2 g 200 mL/hr over 30 Minutes Intravenous Every 24 hours 11/17/18 1049     11/15/18 1500  vancomycin (VANCOCIN) 1,250 mg in sodium chloride 0.9 % 250 mL IVPB  Status:  Discontinued     1,250 mg 166.7 mL/hr over 90 Minutes Intravenous Every 12 hours 11/15/18 1030 11/21/18 1615   11/15/18 0230  vancomycin (VANCOCIN) 1,250 mg in sodium chloride 0.9 % 250 mL IVPB  Status:  Discontinued     1,250 mg 166.7 mL/hr over 90 Minutes Intravenous Every 12 hours 11/14/18 1433 11/14/18 2344   11/15/18 0000  piperacillin-tazobactam (ZOSYN) IVPB 3.375 g  Status:  Discontinued  3.375 g 12.5 mL/hr over 240 Minutes Intravenous Every 8 hours 11/14/18 2359 11/17/18 1049   11/15/18 0000  vancomycin (VANCOCIN) 1,250 mg in sodium chloride 0.9 % 250 mL IVPB     1,250 mg 166.7 mL/hr over 90 Minutes Intravenous 2 times daily 11/14/18 2359 11/15/18 0332   11/14/18 2000  piperacillin-tazobactam (ZOSYN) IVPB 3.375 g  Status:  Discontinued     3.375 g 12.5 mL/hr over 240 Minutes Intravenous Every 8 hours 11/14/18 1433 11/14/18 2344   11/14/18 1345  vancomycin (VANCOCIN) 2,500 mg in sodium chloride 0.9 % 500 mL IVPB     2,500 mg 250 mL/hr over 120 Minutes Intravenous  Once 11/14/18 1344 11/14/18 1622   11/14/18 1345  piperacillin-tazobactam (ZOSYN) IVPB 3.375 g     3.375 g 100 mL/hr over 30 Minutes Intravenous  Once 11/14/18 1344 11/14/18 1441      Alphonsa Overall, MD, FACS Pager: [place] [facility] Office: [phone] 11/22/2018

## 2018-11-22 NOTE — Discharge Summary (Signed)
Name: Drew Evans. MRN: [id number] DOB: 1960-03-09 58 y.o. PCP: Patient, No Pcp Per  Date of Admission: 11/14/2018 12:13 PM Date of Discharge: 11/24/2018 Attending Physician: Aldine Contes, MD  Discharge Diagnosis: 1. Back abscess with sepsis 2. Diabetic ketoacidosis  Discharge Medications: Allergies as of 11/24/2018      Reactions   No Known Allergies       Medication List    STOP taking these medications   HYDROcodone-acetaminophen 5-325 MG tablet Commonly known as: NORCO/VICODIN   metFORMIN 500 MG tablet Commonly known as: GLUCOPHAGE   sulfamethoxazole-trimethoprim 800-160 MG tablet Commonly known as: BACTRIM DS     TAKE these medications   artificial tears Oint ophthalmic ointment Place into the left eye daily as needed (dry eyes).   ARTIFICIAL TEARS OP Apply 1 drop to eye daily as needed (dry eyes).   bacitracin-polymyxin b ophthalmic ointment Commonly known as: POLYSPORIN Place 1 application into the right eye 3 (three) times daily. apply to eye every 12 hours while awake   blood glucose meter kit and supplies Kit Dispense based on patient and insurance preference. Use up to four times daily as directed. (FOR ICD-9 250.00, 250.01).   doxycycline 100 MG tablet Commonly known as: VIBRA-TABS Take 1 tablet (100 mg total) by mouth 2 (two) times daily. Beginning the evening of the day of discharge.   gatifloxacin 0.5 % Soln Commonly known as: ZYMAXID Place 1 drop into the right eye 4 (four) times daily.   ibuprofen 200 MG tablet Commonly known as: ADVIL Take 600 mg by mouth every 6 (six) hours as needed for headache or moderate pain.   Insulin Glargine 100 UNIT/ML Solostar Pen Commonly known as: LANTUS Inject 22 Units into the skin daily.   insulin lispro 100 UNIT/ML KwikPen Commonly known as: HUMALOG Inject 0.04 mLs (4 Units total) into the skin 3 (three) times daily with meals.   oxyCODONE 5 MG immediate release tablet Commonly known as:  Oxy IR/ROXICODONE Take 1-2 tablets (5-10 mg total) by mouth every 6 (six) hours as needed for moderate pain.   prednisoLONE acetate 1 % ophthalmic suspension Commonly known as: PRED FORTE Place 1 drop into the right eye 4 (four) times daily.      Disposition and follow-up:   [name] was discharged from [facility] in Stable condition.  At the hospital follow up visit please address:  1. A. Back abscess with sepsis: Uncontrolled diabetes with large 14cm abscess and surrounding edema s/p 2 I&D's and 10 days Vanc/CTX due to no wound culture speciation. D/C'd on doxy to complete a 14 day course.  B. Diabetic ketoacidosis: Borderline DKA with GAP closing with fluid resuscitation alone. Discharged on Lantus 22U, meal time 4U TID but would recommend a GLP-1. I do not feel an SGLT-2i is absolutely contraindicated but would refrain from use given his diet and probable DKA status on admission. Patient has been eating a lower carb diet so caution large mealtime doses of insulin and SGLT2-i.    2.  Labs / imaging needed at time of follow-up: CBC, BMP, Evaluate wound and repack   3.  Pending labs/ test needing follow-up: n/a  Follow-up Appointments: Follow-up Information    [facility], [place] Follow up on 12/03/2018.   Specialty: General Surgery Why: Your appointment is at 1:45 PM.  Be at the office 30 minutes early for check in.  Bring photo ID and insurance information.   Contact information: [address]  [place] [address] [phone]        Care, [facility] Follow up.   Specialty: [place] Why: [place] arranged- they will contact you to set up home visits for wound care Contact information: [address] [place] [address] [phone]        [facility]. Call.   Why: To make sure an appointment is being scheduled  Contact information: [address] [place] [facility] Course by problem list: Back abscess with sepsis: Patient presented with a 3 to 4-week history of back swelling and drainage.  There was a large 16 cm, fluctuant abscess with purulent, foul-smelling drainage that required a general surgery OR I&D on 9/10 and repeat on 9/16.  Patient was started empirically on vancomycin and ceftriaxone.  Wound culture from I&D showed few normal skin for, blood cultures from day of admission grew Turicella Otitidis and 1 out of 4 cultures - these results were thought to be secondary to contaminants.  Patient's aunt and HH aid assisting with dressing changes per surgery recs BID.  Patient discharged on oral doxycycline to complete total 14-day course, and was provided outpatient follow-up with our clinic.  ?Diabetic Ketoacidosis: T2DM: On presentation, glucose of 374, anion gap of 16, bicarb of 20, VBG with pH of 7.32, urinalysis with ketones.  Patient was on metformin as outpatient, had never been on insulin.  DKA mild likely triggered by infection.  DKA protocol was initiated following surgery but patient's gap closed the same day as surgery after IV fluids given.  Patient transition to subcutaneous insulin which was titrated to obtain glucose control with goal glucose less than 150 for optimal wound healing.  Patient had appropriate control on regimen of 22 units Lantus + 4 units Aspart. Discharged on the same. Please consider further lifestyle changes and GLP-1i.   Discharge Vitals:   BP 140/73 (BP Location: Left Arm)   Pulse 84   Temp 98.4 F (36.9 C) (Oral)   Resp 14   Ht 6' (1.829 m)   Wt 112 kg   SpO2 100%   BMI 33.49 kg/m   Pertinent Labs, Studies, and Procedures:  CBC Latest Ref Rng & Units 11/24/2018 11/23/2018 11/22/2018  WBC 4.0 - 10.5 K/uL 5.4 5.6 7.7  Hemoglobin 13.0 - 17.0 g/dL 9.9(L) 10.5(L) 10.4(L)  Hematocrit 39.0 - 52.0 % 29.7(L) 33.2(L) 31.2(L)  Platelets 150 - 400 K/uL 451(H) 540(H) 431(H)    BMP Latest Ref Rng & Units 11/24/2018 11/23/2018 11/22/2018  Glucose 70 - 99 mg/dL 134(H) 171(H) 116(H)  BUN 6 - 20 mg/dL '7 6 6  ' Creatinine 0.61 - 1.24 mg/dL 0.65 0.73 0.79  Sodium 135 - 145 mmol/L 138 139 137  Potassium 3.5 - 5.1 mmol/L 3.9 3.8 4.1  Chloride 98 - 111 mmol/L 104 100 100  CO2 22 - 32 mmol/L '26 28 28  ' Calcium 8.9 - 10.3 mg/dL 8.4(L) 8.6(L) 8.3(L)   Surgical/Deep Wound Culture (11/14/18): Gram stain: * Abundant Gram Positive Rods * Moderate Gram Positive Cocci * Few Gram Negative Rods Culture: * Few Normal Skin Flora  MRSA PCR Screening (11/20/18): Negative  Blood Culture (11/14/18 @ 1409): Turicella Otitidis  Urine Culture (11/14/18): No growth  Discharge Instructions: Discharge Instructions    Call MD for:  redness, tenderness, or signs of infection (pain, swelling, redness, odor or green/yellow discharge around incision site)   Complete by: As directed  Call MD for:  temperature >100.4   Complete by: As directed    Diet - low sodium heart healthy   Complete by: As directed    Discharge instructions   Complete by: As directed    Please make certain to pick up your medicines from the pharmacy. You will need to continue taking the long acting Lantus insulin at 22units every day but you may take this in the morning with breakfast if you want. You should continue taking the short acting meal time insulin as well but please hold this if you do not eat. Please take you blood sugar levels with each meal and bring the glucometer with you to the doctors appointment.  Please call the nurses station at [facility] with any concerns regarding your medications today. Please call [phone] on weekdays with other concerns 24/7   Increase activity slowly   Complete by: As directed       Signed: Kathi Ludwig, MD 11/24/2018, 1:10 PM   Pager: [phone]

## 2018-11-22 NOTE — Progress Notes (Signed)
  Subjective:  Patient seen at the bedside on rounds this AM. Pt says he was apprehensive about taking a shower this AM but did take one and it went well. Pt says his pain is well controlled today. We dicussed transitioning to an oral antibiotic and the importance of completing his whole course. Pt says his aunt will help him with wound care at home and is available starting Sunday.  Objective:   Vital Signs (last 24 hours): Vitals:   11/21/18 1940 11/21/18 2020 11/21/18 2100 11/22/18 0430  BP:  (!) 148/70  113/68  Pulse:  (!) 40  69  Resp: 19 15 17 18   Temp: 98.4 F (36.9 C)  98.2 F (36.8 C) 97.8 F (36.6 C)  TempSrc: Oral  Oral Oral  SpO2:  100%  94%  Weight:      Height:        Physical Exam: General Alert and answers questions appropriately, no acute distress  Cardiac Regular rate and rhythm no murmurs rubs or gal  Pulmonary Clear to auscultation bilaterally without wheezes, rhonchi, or rales  Extremities No peripheral edema    Assessment/Plan:   Principal Problem:   Abscess of back Active Problems:   DKA (diabetic ketoacidoses) (HCC)   Hypertension   Sepsis ([place])   Pressure injury of skin   Enteritis  Patient is a 58 year old male with past medical history notable for poorly controlled diabetes who presented with 3 to 4-week history of back pain and swelling.  Patient is postop day #8 after initial I&D and postop day #2 after repeat I&D.  # Back abscess complicated by sepsis: Patient continues to improve, afebrile with white count of 7.7 today.  Wound cultures from initial I&D showed few normal skin flora, likely contaminant and blood cultures and 1/4 bottles grew Turicella Otitidis which is also likely contaminant. * Day 9 of vancomycin + ceftriaxone, will switch to oral antibiotics with doxycycline at discharge to complete a 14-day course * Will need twice daily wound dressing changes, and (retired our Marine scientist) will assist with this.  Aunt received training on  dressing changes by surgery  # T2DM: Goal glucose less than 150.  Most CBG readings at goal, one at 184 yesterday at 11 AM.  Will remove sliding scale insulin and continue with 30 units long-acting + 4 units short acting with meal.  Diet: Carb Modified DVT Ppx:  Dispo: Anticipated discharge tomorrow  Earlene Plater, MD 11/22/2018, 8:27 AM Pager: [phone]

## 2018-11-23 LAB — BASIC METABOLIC PANEL
Anion gap: 11 (ref 5–15)
BUN: 6 mg/dL (ref 6–20)
CO2: 28 mmol/L (ref 22–32)
Calcium: 8.6 mg/dL — ABNORMAL LOW (ref 8.9–10.3)
Chloride: 100 mmol/L (ref 98–111)
Creatinine, Ser: 0.73 mg/dL (ref 0.61–1.24)
GFR calc Af Amer: >60 mL/min (ref >60)
GFR calc non Af Amer: >60 mL/min (ref >60)
Glucose, Bld: 171 mg/dL — ABNORMAL HIGH (ref 70–99)
Potassium: 3.8 mmol/L (ref 3.5–5.1)
Sodium: 139 mmol/L (ref 135–145)

## 2018-11-23 LAB — CBC
HCT: 33.2 % — ABNORMAL LOW (ref 39.0–52.0)
Hemoglobin: 10.5 g/dL — ABNORMAL LOW (ref 13.0–17.0)
MCH: 27.1 pg (ref 26.0–34.0)
MCHC: 31.6 g/dL (ref 30.0–36.0)
MCV: 85.6 fL (ref 80.0–100.0)
Platelets: 540 10*3/uL — ABNORMAL HIGH (ref 150–400)
RBC: 3.88 MIL/uL — ABNORMAL LOW (ref 4.22–5.81)
RDW: 13.2 % (ref 11.5–15.5)
WBC: 5.6 10*3/uL (ref 4.0–10.5)
nRBC: 0 % (ref 0.0–0.2)

## 2018-11-23 LAB — GLUCOSE, CAPILLARY
Glucose-Capillary: 176 mg/dL — ABNORMAL HIGH (ref 70–99)
Glucose-Capillary: 154 mg/dL — ABNORMAL HIGH (ref 70–99)
Glucose-Capillary: 147 mg/dL — ABNORMAL HIGH (ref 70–99)
Glucose-Capillary: 97 mg/dL (ref 70–99)
Glucose-Capillary: 164 mg/dL — ABNORMAL HIGH (ref 70–99)

## 2018-11-23 MED ORDER — SENNOSIDES-DOCUSATE SODIUM 8.6-50 MG PO TABS: 1.0000 | ORAL_TABLET | Freq: Two times a day (BID) | ORAL | Status: DC | PRN

## 2018-11-23 MED ORDER — POLYETHYLENE GLYCOL 3350 17 G PO PACK
17.0000 g | PACK | Freq: Every day | ORAL | Status: DC
  Administered 2018-11-23: 17 g via ORAL
  Filled 2018-11-23 (×2): qty 1

## 2018-11-23 MED ORDER — INSULIN GLARGINE 100 UNIT/ML ~~LOC~~ SOLN
24.0000 [IU] | Freq: Every morning | SUBCUTANEOUS | Status: DC
  Administered 2018-11-23 – 2018-11-24 (×2): 24 [IU] via SUBCUTANEOUS
  Filled 2018-11-23 (×2): qty 0.24

## 2018-11-23 NOTE — TOC Progression Note (Signed)
Transition of Care (TOC) - Progression Note  Marvetta Gibbons RN, BSN Transitions of Care Unit 4E- RN Case Manager [phone]   Patient Details  Name: Zakaria Sedor. MRN: [id number] Date of Birth: 02/11/1961  Transition of Care [facility]) CM/SW Contact  Dahlia Client Romeo Rabon, RN Phone Number: 11/23/2018, 12:17 AM  Clinical Narrative:    Follow up done with pt for [facility] agency choice- per pt he states he wants to try either [facility] or [facility]- Call made to [facility] with Alvis Lemmings for [facility]- wound care needs- per Meredeth Ide will accept referral for wound care needs. Per pt plan for d/c on Sunday when Aunt able to assist pt with drsg changes.    Expected Discharge Plan: [place] Barriers to Discharge: Continued Medical Work up  Expected Discharge Plan and Services Expected Discharge Plan: [place]   Discharge Planning Services: CM Consult Post Acute Care Choice: [place] arrangements for the past 2 months: Single Family Home                 DME Arranged: N/A DME Agency: NA       HH Arranged: RN [place] Agency: [place] Date HH Agency Contacted: 11/22/18 Time HH Agency Contacted: 34 Representative spoke with at [place]: [place] ([place]) Interventions    Readmission Risk Interventions No flowsheet data found.

## 2018-11-23 NOTE — Progress Notes (Signed)
  Subjective:  Patient seen at bedside this morning.  Patient denies shortness of breath, reports occasional mild cough.  Patient states he was never on insulin, was on metformin prior to presentation.  However, patient does not want take metformin again due to GI issues.  He reports that his and will be available tomorrow for dressing changes.  Patient performed on plan of care, all questions answered.   Objective:    Vital Signs (last 24 hours): Vitals:   11/21/18 2100 11/22/18 0430 11/22/18 2047 11/23/18 0536  BP:  113/68 133/61 (!) 116/58  Pulse:  69 80 62  Resp: 17 18 16 16   Temp: 98.2 F (36.8 C) 97.8 F (36.6 C) 98.2 F (36.8 C) 98.1 F (36.7 C)  TempSrc: Oral Oral Oral Oral  SpO2:  94% 97% 96%  Weight:      Height:        Physical Exam: General Alert and answers questions appropriately, no acute distress  Cardiac Regular rate and rhythm, no murmurs, rubs, or gallops  Pulmonary Clear to auscultation bilaterally without wheezes, rhonchi, or rales  Extremities No peripheral edema    Assessment/Plan:   Principal Problem:   Abscess of back Active Problems:   DKA (diabetic ketoacidoses) (HCC)   Hypertension   Sepsis ([place])   Pressure injury of skin   Enteritis  Patient is a 58 year old male with past medical history notable for poorly controlled diabetes who presented with 3 to 4-week history of back pain and swelling.  Patient is postop day #9 after initial I&D and postop day #3 after repeat I&D.  # Back abscess complicated by sepsis: Patient continues to do well, afebrile with white count of 5.6 today.  Wound cultures and blood cultures revealed contaminants but did not identify bacterial species. * Day 10 of vancomycin + ceftriaxone, plan is for 1 more day of IV antibiotics and will switch to oral antibiotics with doxycycline at discharge to complete a total 14-day course * We will need twice daily wound dressing changes, and aunt (retired Haematologist) will assist with  this.  Aunt received training on dressing changes by surgery.   # T2DM: Goal glucose less than 150.  Glucose elevated to 179 yesterday evening.  However, patient did not receive his long-acting insulin.  No reason noted by RN in chart.  Diet: Carb modified DVT Ppx: Lovenox 40 mg QD Dispo: Anticipated discharge in 1 day  Jeanmarie Hubert, MD 11/23/2018, 11:20 AM Pager: [phone]

## 2018-11-24 ENCOUNTER — Telehealth (HOSPITAL_BASED_OUTPATIENT_CLINIC_OR_DEPARTMENT_OTHER): Payer: Self-pay

## 2018-11-24 LAB — BASIC METABOLIC PANEL
Anion gap: 8 (ref 5–15)
BUN: 7 mg/dL (ref 6–20)
CO2: 26 mmol/L (ref 22–32)
Calcium: 8.4 mg/dL — ABNORMAL LOW (ref 8.9–10.3)
Chloride: 104 mmol/L (ref 98–111)
Creatinine, Ser: 0.65 mg/dL (ref 0.61–1.24)
GFR calc Af Amer: >60 mL/min (ref >60)
GFR calc non Af Amer: >60 mL/min (ref >60)
Glucose, Bld: 134 mg/dL — ABNORMAL HIGH (ref 70–99)
Potassium: 3.9 mmol/L (ref 3.5–5.1)
Sodium: 138 mmol/L (ref 135–145)

## 2018-11-24 LAB — CBC
HCT: 29.7 % — ABNORMAL LOW (ref 39.0–52.0)
Hemoglobin: 9.9 g/dL — ABNORMAL LOW (ref 13.0–17.0)
MCH: 28.2 pg (ref 26.0–34.0)
MCHC: 33.3 g/dL (ref 30.0–36.0)
MCV: 84.6 fL (ref 80.0–100.0)
Platelets: 451 10*3/uL — ABNORMAL HIGH (ref 150–400)
RBC: 3.51 MIL/uL — ABNORMAL LOW (ref 4.22–5.81)
RDW: 13.3 % (ref 11.5–15.5)
WBC: 5.4 10*3/uL (ref 4.0–10.5)
nRBC: 0 % (ref 0.0–0.2)

## 2018-11-24 LAB — GLUCOSE, CAPILLARY
Glucose-Capillary: 146 mg/dL — ABNORMAL HIGH (ref 70–99)
Glucose-Capillary: 130 mg/dL — ABNORMAL HIGH (ref 70–99)

## 2018-11-24 MED ORDER — OXYCODONE HCL 5 MG PO TABS: 5.0000 mg | ORAL_TABLET | Freq: Four times a day (QID) | ORAL | 0 refills | Status: DC | PRN

## 2018-11-24 NOTE — Progress Notes (Signed)
   Subjective: The patient is agreeable discharge home today. He has several questions regarding his diabetes and wound care. I have attempted to answer all questions and have provided him with additional information for support but he will warrant further explanation in the outpatient setting.   Objective:  Vital signs in last 24 hours: Vitals:   11/23/18 0536 11/23/18 1214 11/23/18 2018 11/24/18 0351  BP: (!) 116/58 132/80 130/73 134/81  Pulse: 62 (!) 103 71 65  Resp: 16 14 14 15   Temp: 98.1 F (36.7 C) 97.8 F (36.6 C) 98.2 F (36.8 C) 98 F (36.7 C)  TempSrc: Oral Oral Oral Oral  SpO2: 96% 96% 97% 97%  Weight:      Height:       General: A/O x4, in no acute distress, afebrile, nondiaphoretic HEENT: PEERL, EMO intact Cardio: RRR, no mrg's  Pulmonary: CTA bilaterally, no wheezing or crackles  Abdomen: Bowel sounds normal, soft, nontender  MSK: Wound recently dressed prior to discharge Psych: Appropriate affect, not depressed in appearance, engages well  Assessment/Plan:  Principal Problem:   Abscess of back Active Problems:   DKA (diabetic ketoacidoses) ([place])   Hypertension   Sepsis ([place])   Pressure injury of skin   Enteritis  [name] is a 58 year old male with past medical history notable for poorly controlled type 2 diabetes mellitus who initially presented with a 3 to 4-week history of progressively worsening back pain from a large purulent draining abscess.  Patient is postop day #10 for his initial I&D and postop day #4 for repeat cleanout.  He has remained stable and improve appropriately with IV antibiotics and tight glucose control.  The patient has home assistance for wound care and has been educated with regard to glucose control.  He is stable for discharge home today.  A/P: Back abscess complicated by sepsis: Improved.  Afebrile without leukocytosis.  Unfortunately, wound cultures did not specify a particular organism.  He will be discharged with empiric  coverage for MRSA given the purulent nature of the abscess. - We will continue doxycycline to complete a 14-day course of antibiotics discharge -- F/up with PCP and General Surgery for wound care  DM type II: Leukosis less than 150 this morning we will continue his current regimen on discharge. -- F/up with PCP and nutritionist   Dispo: Anticipated discharge in approximately 0 day(s).   Kathi Ludwig, MD 11/24/2018, 10:04 AM Pager: # [phone]

## 2018-11-24 NOTE — Plan of Care (Signed)
Adequate for discharge.

## 2018-11-24 NOTE — Discharge Instructions (Signed)
Please call the nurses station at [facility] with any concerns regarding your medications today. Please call [phone] on weekdays with other concerns 24/7.  [facility] - DISCHARGE INSTRUCTIONS TO PATIENT  Wound Care:    Twice a day dressing changes - his Aunt is going to help him with this.         He should shower as much as possible.  Soap in the wound is okay  Mechanical Wound Debridement Wet to dry dressing to open site twice a day  Mechanical wound debridement is a treatment to remove dead tissue from a wound. This helps the wound heal. The treatment involves cleaning the wound by using mechanical force. This may include:  Wound irrigation. This method involves rinsing the wound out with fluid.  Wet-to-dry dressing. This method involves putting wet dressings into the wound, allowing them to dry, then removing the dry dressings to remove debris and tissue from the wound.  Using a pad or gauze to remove dead tissue and debris from the wound.  Pulsatile lavage. This method involves cleaning the wound with a pressurized stream of fluid. Depending on the wound, you may need to repeat this procedure or change to another form of debridement as your wound starts to heal. Tell a health care provider about:  Any allergies you have.  All medicines you are taking, including vitamins, herbs, eye drops, creams, and over-the-counter medicines.  Any blood disorders you have.  Any medical conditions you have, or have had, especially conditions that cause a decrease in blood flow to the wound area, such as peripheral vascular disease, or conditions that affect your body's defense (immune) system or white blood count.  Any surgeries you have had.  Whether you are pregnant or may be pregnant. What are the risks? Generally, this is a safe procedure. However, problems may occur, including:  Infection.  Bleeding.  Damage to healthy tissue in and around your wound.  Soreness or  pain.  Failure of the wound to heal.  Scarring. What happens during the procedure?   Your health care provider may apply a numbing medicine (topical anesthetic) to the wound.  Your health care provider may irrigate your wound with a germ-free salt solution (saline). This helps to loosen or remove debris, bacteria, and dead tissue.  Depending on the type of mechanical wound debridement you are having, your health care provider may do one of the following: ? Put a dressing on your wound. You may have a dry gauze pad placed into the wound. Your health care provider will remove the gauze after the wound is dry. Any dead tissue and debris that has dried into the gauze will be lifted out of the wound (wet-to-dry debridement). ? Use a type of pad (monofilament fiber debridement pad) that has a fluffy surface on one side that picks up dead tissue and debris from your wound. Your health care provider wets the pad and wipes it over your wound for several minutes. ? Flush (irrigate) your wound with a pressurized stream of solution such as saline or water (pulsatile lavage).  Once your health care provider is finished, he or she may apply a light dressing to your wound. The procedure may vary among health care providers and hospitals. What happens after the procedure?  You may get medicine for pain. Summary  Mechanical wound debridement is a treatment to remove dead tissue from a wound. This helps the wound heal.  Depending on the wound, you may need to repeat  this procedure or change to another form of debridement as your wound starts to heal.  This treatment may include: wound irrigation, wet-to-dry dressing, using a pad or gauze to remove dead tissue and debris from the wound, or pulsatile lavage. This information is not intended to replace advice given to you by your health care provider. Make sure you discuss any questions you have with your health care provider. Document Released: 11/11/2014  Document Revised: 01/28/2018 Document Reviewed: 01/28/2018 Elsevier Patient Education  2020 Elsevier Inc.    Insulin Injection Instructions, Using Insulin Pens, Adult A subcutaneous injection is a shot of medicine that is injected into the layer of fat and tissue between skin and muscle. People with type 1 diabetes must take insulin because their bodies do not make it. People with type 2 diabetes may need to take insulin. There are many different types of insulin. The type of insulin that you take may determine how many injections you give yourself and when you need to give the injections. Supplies needed:  Soap and water to wash hands.  Your insulin pen.  A new, unused needle.  Alcohol wipes.  A disposal container that is meant for sharp items (sharps container), such as an empty plastic bottle with a cover. How to choose a site for injection The body absorbs insulin differently, depending on where the insulin is injected (injection site). It is best to inject insulin into the same body area each time (for example, always in the abdomen), but you should use a different spot in that area for each injection. Do not inject the insulin in the same spot each time. There are five main areas that can be used for injecting. These areas include:  Abdomen. This is the preferred area.  Front of thigh.  Upper, outer side of thigh.  Upper, outer side of arm.  Upper, outer part of buttock. How to use an insulin pen  First, follow the steps for Get ready, then continue with the steps for Inject the insulin. Get ready 1. Wash your hands with soap and water. If soap and water are not available, use hand sanitizer. 2. Before you give yourself an insulin injection, be sure to test your blood sugar level (blood glucose level) and write down that number. Follow any instructions from your health care provider about what to do if your blood glucose level is higher or lower than your normal  range. 3. Check the expiration date and the type of insulin that is in the pen. 4. If you are using CLEAR insulin, check to see that it is clear and free of clumps. 5. If you are using CLOUDY insulin, do not shake the pen to get the injection ready. Instead, get it ready in one of these ways: ? Gently roll the pen between your palms several times. ? Tip the pen up and down several times. 6. Remove the cap from the insulin pen. 7. Use an alcohol wipe to clean the rubber tip of the pen. 8. Remove the protective paper tab from the disposable needle. Do not let the needle touch anything. 9. Screw a new, unused needle onto the pen. 10. Remove the outer plastic needle cover. Do not throw away the outer plastic cover yet. ? If the pen uses a special safety needle, leave the inner needle shield in place. ? If the pen does not use a special safety needle, remove the inner plastic cover from the needle. 11. Follow the manufacturer's instructions to prime the  insulin pen with the volume of insulin needed. Hold the pen with the needle pointing up, and push the button on the opposite end of the pen until a drop of insulin appears at the needle tip. If no insulin appears, repeat this step. 12. Turn the button (dial) to the number of units of insulin that you will be injecting. Inject the insulin 1. Use an alcohol wipe to clean the site where you will be injecting the needle. Let the site air-dry. 2. Hold the pen in the palm of your writing hand like a pencil. 3. If directed by your health care provider, use your other hand to pinch and hold about an inch (2.5 cm) of skin at the injection site. Do not directly touch the cleaned part of the skin. 4. Gently but quickly, use your writing hand to put the needle straight into the skin. The needle should be at a 90-degree angle (perpendicular) to the skin. 5. When the needle is completely inserted into the skin, use your thumb or index finger of your writing hand to  push the top button of the pen down all the way to inject the insulin. 6. Let go of the skin that you are pinching. Continue to hold the pen in place with your writing hand. 7. Wait 10 seconds, then pull the needle straight out of the skin. This will allow all of the insulin to go from the pen and needle into your body. 8. Carefully put the larger (outer) plastic cover of the needle back over the needle, then unscrew the capped needle and discard it in a sharps container, such as an empty plastic bottle with a cover. 9. Put the plastic cap back on the insulin pen. How to throw away supplies  Discard all used needles in a puncture-proof sharps disposal container. You can ask your local pharmacy about where you can get this kind of disposal container, or you can use an empty plastic liquid laundry detergent bottle that has a cover.  Follow the disposal regulations for the area where you live. Do not use any needle more than one time.  Throw away empty disposable pens in the regular trash. Questions to ask your health care provider  How often should I be taking insulin?  How often should I check my blood glucose?  What amount of insulin should I be taking at each time?  What are the side effects?  What should I do if my blood glucose is too high?  What should I do if my blood glucose is too low?  What should I do if I forget to take my insulin?  What number should I call if I have questions? Where to find more information  American Diabetes Association (ADA): www.diabetes.org  American Association of Diabetes Educators (AADE) Patient Resources: https://www.diabeteseducator.org Summary  A subcutaneous injection is a shot of medicine that is injected into the layer of fat and tissue between skin and muscle.  Before you give yourself an insulin injection, be sure to test your blood sugar level (blood glucose level) and write down that number.  Check the expiration date and the type  of insulin that is in the pen. The type of insulin that you take may determine how many injections you give yourself and when you need to give the injections.  It is best to inject insulin into the same body area each time (for example, always in the abdomen), but you should use a different spot in that area  for each injection. This information is not intended to replace advice given to you by your health care provider. Make sure you discuss any questions you have with your health care provider. Document Released: 03/26/2015 Document Revised: 03/12/2017 Document Reviewed: 03/26/2015 Elsevier Patient Education  2020 [employer]

## 2018-11-24 NOTE — Telephone Encounter (Signed)
[facility] Dr Blaine Hamper received call from pt stating he doesn't have any insulin needles (pt dcd from hospital today) Pt dcd by Dr Berline Lopes.  Dr Blaine Hamper reached out to current writer regarding what to do and current Probation officer opened pts chart.  Pt was dcd by Dr Berline Lopes Res under Dr Dareen Piano of [facility].  Dr Blaine Hamper provided contact info for Dr Dareen Piano who is on tonight as attending so he can provide them with pts contact info and reason for call.

## 2018-11-25 ENCOUNTER — Telehealth: Payer: Self-pay | Admitting: Internal Medicine

## 2018-11-25 ENCOUNTER — Other Ambulatory Visit: Payer: Self-pay

## 2018-11-25 MED ORDER — "INSULIN SYRINGE-NEEDLE U-100 31G X 5/16"" 0.5 ML MISC": 0 refills | Status: DC

## 2018-11-25 MED ORDER — "PEN NEEDLES 5/16"" 30G X 8 MM MISC": 1 refills | Status: AC

## 2018-11-25 NOTE — Telephone Encounter (Signed)
Requesting to speak with a nurse about meds. States there's no needles for the insulin. Please call pt back.

## 2018-11-25 NOTE — Telephone Encounter (Signed)
Script for insulin pen needles given verbally to the pharmacist at the patients preferred [facility]. Patient notified via phone and will pick these up.

## 2018-11-27 ENCOUNTER — Ambulatory Visit (INDEPENDENT_AMBULATORY_CARE_PROVIDER_SITE_OTHER): Payer: Medicare Other | Admitting: Internal Medicine

## 2018-11-27 ENCOUNTER — Other Ambulatory Visit: Payer: Self-pay

## 2018-11-27 VITALS — BP 136/75 | HR 76 | Temp 98.1°F | Ht 72.0 in | Wt 245.0 lb

## 2018-11-27 DIAGNOSIS — L02212 Cutaneous abscess of back [any part, except buttock]: Secondary | ICD-10-CM | POA: Diagnosis not present

## 2018-11-27 DIAGNOSIS — E119 Type 2 diabetes mellitus without complications: Secondary | ICD-10-CM

## 2018-11-27 DIAGNOSIS — Z794 Long term (current) use of insulin: Secondary | ICD-10-CM

## 2018-11-27 DIAGNOSIS — Z87891 Personal history of nicotine dependence: Secondary | ICD-10-CM

## 2018-11-27 DIAGNOSIS — Z792 Long term (current) use of antibiotics: Secondary | ICD-10-CM

## 2018-11-27 DIAGNOSIS — E118 Type 2 diabetes mellitus with unspecified complications: Secondary | ICD-10-CM | POA: Diagnosis not present

## 2018-11-27 MED ORDER — DOXYCYCLINE HYCLATE 100 MG PO TABS: 100.0000 mg | ORAL_TABLET | Freq: Two times a day (BID) | ORAL | 0 refills | Status: AC

## 2018-11-27 NOTE — Patient Instructions (Addendum)
Drew Evans,  I am glad you are feeling better after your hospitalization. Continue to do dressing changes and take your antibiotic twice a day. You last day of antibiotic should be on 12/09/2018. Please follow-up with our clinic in 2 weeks, and sooner if you begin experiencing pain, fevers, or new/worsening symptoms at home.  Please increase your mealtime insulin to 6 units and continue to check your blood sugars.      Blood Glucose Monitoring, Adult Monitoring your blood sugar (glucose) is an important part of managing your diabetes (diabetes mellitus). Blood glucose monitoring involves checking your blood glucose as often as directed and keeping a record (log) of your results over time. Checking your blood glucose regularly and keeping a blood glucose log can:  Help you and your health care provider adjust your diabetes management plan as needed, including your medicines or insulin.  Help you understand how food, exercise, illnesses, and medicines affect your blood glucose.  Let you know what your blood glucose is at any time. You can quickly find out if you have low blood glucose (hypoglycemia) or high blood glucose (hyperglycemia). Your health care provider will set individualized treatment goals for you. Your goals will be based on your age, other medical conditions you have, and how you respond to diabetes treatment. Generally, the goal of treatment is to maintain the following blood glucose levels:  Before meals (preprandial): 80-130 mg/dL (0.4-5.4 mmol/L).  After meals (postprandial): below 180 mg/dL (10 mmol/L).  A1c level: less than 7%. Supplies needed:  Blood glucose meter.  Test strips for your meter. Each meter has its own strips. You must use the strips that came with your meter.  A needle to prick your finger (lancet). Do not use a lancet more than one time.  A device that holds the lancet (lancing device).  A journal or log book to write down your results. How to  check your blood glucose  1. Wash your hands with soap and water. 2. Prick the side of your finger (not the tip) with the lancet. Use a different finger each time. 3. Gently rub the finger until a small drop of blood appears. 4. Follow instructions that come with your meter for inserting the test strip, applying blood to the strip, and using your blood glucose meter. 5. Write down your result and any notes. Some meters allow you to use areas of your body other than your finger (alternative sites) to test your blood. The most common alternative sites are:  Forearm.  Thigh.  Palm of the hand. If you think you may have hypoglycemia, or if you have a history of not knowing when your blood glucose is getting low (hypoglycemia unawareness), do not use alternative sites. Use your finger instead. Alternative sites may not be as accurate as the fingers, because blood flow is slower in these areas. This means that the result you get may be delayed, and it may be different from the result that you would get from your finger. Follow these instructions at home: Blood glucose log   Every time you check your blood glucose, write down your result. Also write down any notes about things that may be affecting your blood glucose, such as your diet and exercise for the day. This information can help you and your health care provider: ? Look for patterns in your blood glucose over time. ? Adjust your diabetes management plan as needed.  Check if your meter allows you to download your records to a  computer. Most glucose meters store a record of glucose readings in the meter. If you have type 1 diabetes:  Check your blood glucose 2 or more times a day.  Also check your blood glucose: ? Before every insulin injection. ? Before and after exercise. ? Before meals. ? 2 hours after a meal. ? Occasionally between 2:00 a.m. and 3:00 a.m., as directed. ? Before potentially dangerous tasks, like driving or using  heavy machinery. ? At bedtime.  You may need to check your blood glucose more often, up to 6-10 times a day, if you: ? Use an insulin pump. ? Need multiple daily injections (MDI). ? Have diabetes that is not well-controlled. ? Are ill. ? Have a history of severe hypoglycemia. ? Have hypoglycemia unawareness. If you have type 2 diabetes:  If you take insulin or other diabetes medicines, check your blood glucose 2 or more times a day.  If you are on intensive insulin therapy, check your blood glucose 4 or more times a day. Occasionally, you may also need to check between 2:00 a.m. and 3:00 a.m., as directed.  Also check your blood glucose: ? Before and after exercise. ? Before potentially dangerous tasks, like driving or using heavy machinery.  You may need to check your blood glucose more often if: ? Your medicine is being adjusted. ? Your diabetes is not well-controlled. ? You are ill. General tips  Always keep your supplies with you.  If you have questions or need help, all blood glucose meters have a 24-hour "hotline" phone number that you can call. You may also contact your health care provider.  After you use a few boxes of test strips, adjust (calibrate) your blood glucose meter by following instructions that came with your meter. Contact a health care provider if:  Your blood glucose is at or above 240 mg/dL (13.3 mmol/L) for 2 days in a row.  You have been sick or have had a fever for 2 days or longer, and you are not getting better.  You have any of the following problems for more than 6 hours: ? You cannot eat or drink. ? You have nausea or vomiting. ? You have diarrhea. Get help right away if:  Your blood glucose is lower than 54 mg/dL (3 mmol/L).  You become confused or you have trouble thinking clearly.  You have difficulty breathing.  You have moderate or large ketone levels in your urine. Summary  Monitoring your blood sugar (glucose) is an important  part of managing your diabetes (diabetes mellitus).  Blood glucose monitoring involves checking your blood glucose as often as directed and keeping a record (log) of your results over time.  Your health care provider will set individualized treatment goals for you. Your goals will be based on your age, other medical conditions you have, and how you respond to diabetes treatment.  Every time you check your blood glucose, write down your result. Also write down any notes about things that may be affecting your blood glucose, such as your diet and exercise for the day. This information is not intended to replace advice given to you by your health care provider. Make sure you discuss any questions you have with your health care provider. Document Released: 02/23/2003 Document Revised: 12/14/2017 Document Reviewed: 08/02/2015 Elsevier Patient Education  2020 [employer].

## 2018-11-29 DIAGNOSIS — Z794 Long term (current) use of insulin: Secondary | ICD-10-CM | POA: Insufficient documentation

## 2018-11-29 DIAGNOSIS — E119 Type 2 diabetes mellitus without complications: Secondary | ICD-10-CM | POA: Insufficient documentation

## 2018-11-29 NOTE — Assessment & Plan Note (Signed)
Pt with history type II diabetes on metformin prior to hospitalization was found to have mild DKA on admission thought to be triggered by his infection. His A1c was 12 on 9/11. He was started on insulin in the hospital and discharged on Lantus 22U and Novolog 4U at mealtime. Pt states he has had no trouble with taking the insulin since discharge. His meter readings for the last 3 days show an average glucose of 175 with elevated readings >180 both in the morning and after meals.  Discussed with the pt the importance of controlling the blood sugar for both his diabetes and good wound healing and the recommendation to start an additional agent. Pt felt overwhelmed by having just started insulin and did not feel ready for an additional medication. He did feel confident in his insulin regimen and was agreeable to increasing the mealtime dose.  Assessment - Uncontrolled type II diabetes  Plan - increase mealtime insulin to Novolog 6U TID - continue Lantus 22U  - instructed pt to continue checking his sugars and bring in his meter at his next visit - follow-up in 2 weeks to discuss further insulin titration vs SGLT-2 inhibitor

## 2018-11-29 NOTE — Assessment & Plan Note (Signed)
Pt discharged from the hospital 3 days ago after being admitted for a large back abscess s/p two I&Ds. Pt's aunt is helping him with twice daily dressing changes of pt's back wound. She is a retired [employer] scientist and here with him today. She says she is feeling confident with the wound care. Home health has also been out to see him for additional wound care. He is schedule to follow-up with surgery next week on 9/29. Denies fevers, chills, nausea, or diarrhea at home. Has not needed any additional pain medication since discharge. Pt endorses taking doxycycline 100mg  twice daily. On exam today the wound measures approximately 10cm X 6cm X 3cm and the surrounding erythema is improved.  Assessment - Healing back wound from abscess  Plan - continue doxycycline 100mg  BID until course completed on 12/09/2018 - will continue to manage diabetes for optimal wound healing - continue HH for wound care - follow-up with surgery next week

## 2018-11-29 NOTE — Progress Notes (Signed)
   CC: back abscess and hospital follow-up  HPI:  [name] is a 58 y.o. M with signficant PMH as outlined below who was recently discharged after a hospital admission for large back abscess complicated by sepsis and DKA. He presents today for hospital follow-up and to establish care. Please see problem-based charting for additional information.  Past Medical History:  Diagnosis Date  . Arthritis    lumbar degenerative   . Diabetes mellitus without complication [facility])    Social History Pt has history of smokeless tobacco use. Denies any current tobacco use. No history of EtOH or substance use. Lives with his son at home.  Family History Father and grandfather with diabetes. Father with "heart issues."  Review of Systems:   Review of Systems  Constitutional: Negative for chills and fever.  Eyes: Negative for blurred vision.  Respiratory: Negative for shortness of breath.   Cardiovascular: Negative for chest pain.  Gastrointestinal: Negative for abdominal pain, diarrhea and nausea.  Musculoskeletal: Negative for back pain and neck pain.  Neurological: Negative for dizziness, weakness and headaches.   Physical Exam:  Vitals:   11/27/18 1418  BP: 136/75  Pulse: 76  Temp: 98.1 F (36.7 C)  TempSrc: Oral  SpO2: 100%  Weight: 245 lb (111.1 kg)  Height: 6' (1.829 m)   Physical Exam Constitutional:      General: He is not in acute distress. Cardiovascular:     Rate and Rhythm: Normal rate and regular rhythm.     Heart sounds: Normal heart sounds.  Pulmonary:     Effort: Pulmonary effort is normal.     Breath sounds: Normal breath sounds.  Skin:    General: Skin is warm and dry.     Comments: See photos. Wound measures approximately 10cm wide X 6cm length X 3 cm deep. Without purulent drainage. Erythema improved from prior photos during hospitalization.  Neurological:     Mental Status: He is alert.            Assessment & Plan:   See Encounters  Tab for problem based charting.  Patient seen with Dr. Lynnae January

## 2018-12-02 NOTE — Progress Notes (Signed)
Internal Medicine Clinic Attending  I saw and evaluated the patient.  I personally confirmed the key portions of the history and exam documented by Dr. Jones and I reviewed pertinent patient test results.  The assessment, diagnosis, and plan were formulated together and I agree with the documentation in the resident's note.     

## 2018-12-03 NOTE — Addendum Note (Signed)
Addended by: Ladona Horns on: 12/03/2018 09:27 AM   Modules accepted: Level of Service

## 2018-12-05 DIAGNOSIS — E119 Type 2 diabetes mellitus without complications: Secondary | ICD-10-CM | POA: Diagnosis not present

## 2018-12-05 DIAGNOSIS — I10 Essential (primary) hypertension: Secondary | ICD-10-CM | POA: Diagnosis not present

## 2018-12-05 DIAGNOSIS — L02212 Cutaneous abscess of back [any part, except buttock]: Secondary | ICD-10-CM | POA: Diagnosis not present

## 2018-12-05 DIAGNOSIS — G9340 Encephalopathy, unspecified: Secondary | ICD-10-CM | POA: Diagnosis not present

## 2018-12-05 DIAGNOSIS — Z792 Long term (current) use of antibiotics: Secondary | ICD-10-CM | POA: Diagnosis not present

## 2018-12-09 DIAGNOSIS — L02212 Cutaneous abscess of back [any part, except buttock]: Secondary | ICD-10-CM | POA: Diagnosis not present

## 2018-12-10 DIAGNOSIS — I10 Essential (primary) hypertension: Secondary | ICD-10-CM | POA: Diagnosis not present

## 2018-12-10 DIAGNOSIS — L02212 Cutaneous abscess of back [any part, except buttock]: Secondary | ICD-10-CM | POA: Diagnosis not present

## 2018-12-10 DIAGNOSIS — Z792 Long term (current) use of antibiotics: Secondary | ICD-10-CM | POA: Diagnosis not present

## 2018-12-10 DIAGNOSIS — E119 Type 2 diabetes mellitus without complications: Secondary | ICD-10-CM | POA: Diagnosis not present

## 2018-12-10 DIAGNOSIS — G9340 Encephalopathy, unspecified: Secondary | ICD-10-CM | POA: Diagnosis not present

## 2018-12-11 ENCOUNTER — Other Ambulatory Visit: Payer: Self-pay

## 2018-12-11 ENCOUNTER — Ambulatory Visit (INDEPENDENT_AMBULATORY_CARE_PROVIDER_SITE_OTHER): Payer: Medicare Other | Admitting: Radiation Oncology

## 2018-12-11 VITALS — BP 131/69 | HR 83 | Wt 238.0 lb

## 2018-12-11 DIAGNOSIS — Z794 Long term (current) use of insulin: Secondary | ICD-10-CM | POA: Diagnosis not present

## 2018-12-11 DIAGNOSIS — E119 Type 2 diabetes mellitus without complications: Secondary | ICD-10-CM

## 2018-12-11 DIAGNOSIS — L02212 Cutaneous abscess of back [any part, except buttock]: Secondary | ICD-10-CM | POA: Diagnosis not present

## 2018-12-11 DIAGNOSIS — Z0000 Encounter for general adult medical examination without abnormal findings: Secondary | ICD-10-CM

## 2018-12-11 DIAGNOSIS — Z23 Encounter for immunization: Secondary | ICD-10-CM

## 2018-12-11 MED ORDER — INSULIN GLARGINE 100 UNIT/ML SOLOSTAR PEN: 25.0000 [IU] | PEN_INJECTOR | Freq: Every day | SUBCUTANEOUS | 1 refills | Status: DC

## 2018-12-11 NOTE — Progress Notes (Signed)
Internal Medicine Clinic Attending  I saw and evaluated the patient.  I personally confirmed the key portions of the history and exam documented by Dr. Lanier and I reviewed pertinent patient test results.  The assessment, diagnosis, and plan were formulated together and I agree with the documentation in the resident's note.   

## 2018-12-11 NOTE — Assessment & Plan Note (Signed)
-  pt with known thoracic abscess s/p 2 I&Ds -saw surgery last week who recommends BID dressing changes and follow up in 6-8 weeks -his aunt is a surgery nurse of 40 years and is helping with dressing changes; she reports improvement in size and appearance of the wound -he finished his doxycycline course  -denies fevers or chills -has relatively little pain at the site and sleeps without difficulty  -wound improved today on exam measuring 7 x 8 cm with minimal surrounding erythema -continue BID dressing changes -continue to manage diabetes for optimal wound healing -follow up for wound check in 2 weeks

## 2018-12-11 NOTE — Assessment & Plan Note (Signed)
-  TDAP administered today -pt declined flu and pneumonia vaccines -declined gastroenterology referral or FOBT  -will schedule appointment with ophthalmologist  -reports he has been tested for Hep C in the past and was negative

## 2018-12-11 NOTE — Progress Notes (Signed)
   CC: diabetes and back abscess  HPI:  [name] is a 58 y.o. M is here for follow up of chronic medical conditions and recent back abscess.  Past Medical History:  Diagnosis Date  . Arthritis    lumbar degenerative   . Diabetes mellitus without complication ([place])    Review of Systems:    Review of Systems  Constitutional: Positive for weight loss (8 lb weight loss per patient secondary to dietary changes). Negative for chills and fever.  Eyes: Negative.   Cardiovascular: Positive for leg swelling (some lower extremity swelling associated with salt intake).  Gastrointestinal: Negative for abdominal pain.  Musculoskeletal: Positive for back pain (minimal back pain associated with tight packing of his wound).  Psychiatric/Behavioral: The patient does not have insomnia (no trouble sleeping secondary to back wound).   All other systems reviewed and are negative.  Physical Exam:  Vitals:   12/11/18 1009  BP: 131/69  Pulse: 83  SpO2: 99%  Weight: 238 lb (108 kg)    Physical Exam  Constitutional: He is oriented to person, place, and time and well-developed, well-nourished, and in no distress. No distress.  HENT:  Head: Normocephalic.  Eyes:  Wears spectacles  Neck: Normal range of motion.  Cardiovascular: Normal rate and regular rhythm.  No murmur heard. Pulmonary/Chest: Effort normal and breath sounds normal. No respiratory distress.  Abdominal: Soft. Bowel sounds are normal. He exhibits no distension.  Musculoskeletal: Normal range of motion.  Neurological: He is alert and oriented to person, place, and time.  Skin: Skin is warm and dry. He is not diaphoretic.  7 x 8 cm wound over thoracic back without surrounding erythema  Psychiatric: Affect normal.  Nursing note and vitals reviewed.   Assessment & Plan:   See Encounters Tab for problem based charting.  Patient seen with Dr. Dareen Piano

## 2018-12-11 NOTE — Assessment & Plan Note (Signed)
-  glucose monitor shows 23% of blood sugars within target range with some elevated blood sugars in the 160s to 180s in the mornings -increase lantus from 22 to 25 units daily -continue 6 units mealtime insulin -patient has made significant dietary changes and reports an 8 lb weight loss -understands the need for good glucose control in setting of back wound -last A1c 3 weeks ago was 12, will recheck in 2 months

## 2018-12-11 NOTE — Patient Instructions (Addendum)
Thank you for coming to your appointment. It was so nice to see you. Today we discussed  Diagnoses and all orders for this visit:  Abscess of back -continue twice daily packing -follow up in 2 weeks for wound check   Healthcare maintenance -TDAP vaccine today -no flu or pneumonia today  Type 2 diabetes mellitus without complication, with long-term current use of insulin (HCC) -increase long acting insulin from 22 to 25 units daily -continue checking sugar -follow up with your ophthalmologist  -urine study today    If labs were drawn today, I will call you with any abnormal results. Otherwise, please keep up the good work. I look forward to seeing you again soon at your follow up in 2 weeks.  Sincerely,  Al Decant, MD

## 2018-12-12 LAB — MICROALBUMIN / CREATININE URINE RATIO
Creatinine, Urine: 143.3 mg/dL
Microalb/Creat Ratio: 5 mg/g creat (ref 0–29)
Microalbumin, Urine: 6.9 ug/mL

## 2018-12-14 ENCOUNTER — Other Ambulatory Visit: Payer: Self-pay | Admitting: Internal Medicine

## 2018-12-16 ENCOUNTER — Other Ambulatory Visit: Payer: Self-pay | Admitting: Internal Medicine

## 2018-12-17 DIAGNOSIS — I10 Essential (primary) hypertension: Secondary | ICD-10-CM | POA: Diagnosis not present

## 2018-12-17 DIAGNOSIS — E119 Type 2 diabetes mellitus without complications: Secondary | ICD-10-CM | POA: Diagnosis not present

## 2018-12-17 DIAGNOSIS — G9340 Encephalopathy, unspecified: Secondary | ICD-10-CM | POA: Diagnosis not present

## 2018-12-17 DIAGNOSIS — L02212 Cutaneous abscess of back [any part, except buttock]: Secondary | ICD-10-CM | POA: Diagnosis not present

## 2018-12-17 DIAGNOSIS — Z792 Long term (current) use of antibiotics: Secondary | ICD-10-CM | POA: Diagnosis not present

## 2018-12-18 NOTE — Telephone Encounter (Signed)
Drew Evans, it looks like your note from the 7th advised the patient to increase his insulin to 25U of lantus and 6U of short acting TID WC but it appears that this wasn't changed in the chart? I provided a refill based on the note. Please let me know if there was something that I missed. Thank you

## 2018-12-19 NOTE — Telephone Encounter (Signed)
OK yes that refill is correct. Thank you!

## 2018-12-24 DIAGNOSIS — Z792 Long term (current) use of antibiotics: Secondary | ICD-10-CM | POA: Diagnosis not present

## 2018-12-24 DIAGNOSIS — L02212 Cutaneous abscess of back [any part, except buttock]: Secondary | ICD-10-CM | POA: Diagnosis not present

## 2018-12-24 DIAGNOSIS — G9340 Encephalopathy, unspecified: Secondary | ICD-10-CM | POA: Diagnosis not present

## 2018-12-24 DIAGNOSIS — I10 Essential (primary) hypertension: Secondary | ICD-10-CM | POA: Diagnosis not present

## 2018-12-24 DIAGNOSIS — E119 Type 2 diabetes mellitus without complications: Secondary | ICD-10-CM | POA: Diagnosis not present

## 2018-12-25 ENCOUNTER — Ambulatory Visit (INDEPENDENT_AMBULATORY_CARE_PROVIDER_SITE_OTHER): Payer: Medicare Other | Admitting: Internal Medicine

## 2018-12-25 ENCOUNTER — Other Ambulatory Visit: Payer: Self-pay

## 2018-12-25 VITALS — BP 137/86 | HR 78 | Temp 98.9°F | Wt 243.6 lb

## 2018-12-25 DIAGNOSIS — E119 Type 2 diabetes mellitus without complications: Secondary | ICD-10-CM

## 2018-12-25 DIAGNOSIS — L02212 Cutaneous abscess of back [any part, except buttock]: Secondary | ICD-10-CM | POA: Diagnosis not present

## 2018-12-25 DIAGNOSIS — Z794 Long term (current) use of insulin: Secondary | ICD-10-CM | POA: Diagnosis not present

## 2018-12-25 NOTE — Assessment & Plan Note (Signed)
Patient presents for continued management of his diabetes. He is currently on Lantus 25 units daily and Humalog six units with meals. He was previously admitted with DKA. He brings in his CBG monitor today. His average glucose over the period is 148. He has had no hypoglycemia. He did have a 4 to 5 day stretch of elevated blood sugars at approximately 180 to 200 last week. Since that time sugars have come back down her back within the normal range. Discussed the importance of glucose control for wound healing.  A/P: - Continue Lantus 25 units QD and Humalog 6 units TID WC  - Referral to [facility] for nutrition consoling

## 2018-12-25 NOTE — Patient Instructions (Signed)
Thank you for allowing [place] to provide your care. Continue your wound care as the surgeons have advised. If you start to noticed increasing pain, redness around the wound, purulent discharge, or fevers please call us immediately. For your diabetes keep up the good work. We're not making any changes to her medication regimen at this point. I would like to see you back in four weeks or sooner if any issues arise.

## 2018-12-25 NOTE — Assessment & Plan Note (Signed)
Patient presents for continued evaluation and management of an abscess on his back. He is now status post incision and drainage x2. He has been following with general surgery. His aunt who is a surgical nurse has been packing his wounds twice daily with wet to dry dressing's. They have been cleaning it in the shower. He states that on Friday he noticed some greenish discharge but it has since resolved. He denies any fevers, chills, increasing pain, or worsening redness.  A/P: - Continue to follow with surgery and do wet to dry dressings two times a day.  - Patient will call if he notices increasing pain, redness around the wound, purulent discharge, or fevers.  - Recheck in 4 weeks.

## 2018-12-25 NOTE — Progress Notes (Signed)
   CC: Back Abscess, diabetes  HPI:  [name] is a 58 y.o. male with PMHx listed below presenting for back abscess, diabetes. Please see the A&P for the status of the patient's chronic medical problems.  Past Medical History:  Diagnosis Date  . Arthritis    lumbar degenerative   . Diabetes mellitus without complication ([place])    Review of Systems:  Performed and all others negative.  Physical Exam: Vitals:   12/25/18 0950  BP: 137/86  Pulse: 78  Temp: 98.9 F (37.2 C)  TempSrc: Oral  SpO2: 100%  Weight: 243 lb 9.6 oz (110.5 kg)   General: Well nourished male in no acute distress Pulm: Good air movement with no wheezing or crackles  CV: RRR, no murmurs, no rubs      Assessment & Plan:   See Encounters Tab for problem based charting.  Patient discussed with Dr. Heber Evans

## 2018-12-29 NOTE — Progress Notes (Signed)
Internal Medicine Clinic Attending  Case discussed with Dr. Helberg at the time of the visit.  We reviewed the resident's history and exam and pertinent patient test results.  I agree with the assessment, diagnosis, and plan of care documented in the resident's note.    

## 2018-12-30 DIAGNOSIS — L02212 Cutaneous abscess of back [any part, except buttock]: Secondary | ICD-10-CM | POA: Diagnosis not present

## 2018-12-30 DIAGNOSIS — I10 Essential (primary) hypertension: Secondary | ICD-10-CM | POA: Diagnosis not present

## 2018-12-30 DIAGNOSIS — Z792 Long term (current) use of antibiotics: Secondary | ICD-10-CM | POA: Diagnosis not present

## 2018-12-30 DIAGNOSIS — G9340 Encephalopathy, unspecified: Secondary | ICD-10-CM | POA: Diagnosis not present

## 2018-12-30 DIAGNOSIS — E119 Type 2 diabetes mellitus without complications: Secondary | ICD-10-CM | POA: Diagnosis not present

## 2019-01-07 DIAGNOSIS — I10 Essential (primary) hypertension: Secondary | ICD-10-CM | POA: Diagnosis not present

## 2019-01-07 DIAGNOSIS — L02212 Cutaneous abscess of back [any part, except buttock]: Secondary | ICD-10-CM | POA: Diagnosis not present

## 2019-01-07 DIAGNOSIS — L7682 Other postprocedural complications of skin and subcutaneous tissue: Secondary | ICD-10-CM | POA: Diagnosis not present

## 2019-01-07 DIAGNOSIS — Z792 Long term (current) use of antibiotics: Secondary | ICD-10-CM | POA: Diagnosis not present

## 2019-01-07 DIAGNOSIS — G9340 Encephalopathy, unspecified: Secondary | ICD-10-CM | POA: Diagnosis not present

## 2019-01-07 DIAGNOSIS — E119 Type 2 diabetes mellitus without complications: Secondary | ICD-10-CM | POA: Diagnosis not present

## 2019-01-10 DIAGNOSIS — L02212 Cutaneous abscess of back [any part, except buttock]: Secondary | ICD-10-CM | POA: Diagnosis not present

## 2019-01-15 ENCOUNTER — Encounter: Payer: Self-pay | Admitting: Dietician

## 2019-01-15 ENCOUNTER — Ambulatory Visit (INDEPENDENT_AMBULATORY_CARE_PROVIDER_SITE_OTHER): Payer: Medicare Other | Admitting: Dietician

## 2019-01-15 ENCOUNTER — Encounter: Payer: Self-pay | Admitting: Internal Medicine

## 2019-01-15 ENCOUNTER — Other Ambulatory Visit: Payer: Self-pay

## 2019-01-15 ENCOUNTER — Ambulatory Visit (INDEPENDENT_AMBULATORY_CARE_PROVIDER_SITE_OTHER): Payer: Medicare Other | Admitting: Internal Medicine

## 2019-01-15 VITALS — BP 152/81 | HR 80 | Temp 98.4°F | Ht 72.0 in | Wt 245.9 lb

## 2019-01-15 DIAGNOSIS — I10 Essential (primary) hypertension: Secondary | ICD-10-CM

## 2019-01-15 DIAGNOSIS — Z792 Long term (current) use of antibiotics: Secondary | ICD-10-CM | POA: Diagnosis not present

## 2019-01-15 DIAGNOSIS — Z794 Long term (current) use of insulin: Secondary | ICD-10-CM | POA: Diagnosis not present

## 2019-01-15 DIAGNOSIS — Z713 Dietary counseling and surveillance: Secondary | ICD-10-CM | POA: Diagnosis not present

## 2019-01-15 DIAGNOSIS — E119 Type 2 diabetes mellitus without complications: Secondary | ICD-10-CM | POA: Diagnosis not present

## 2019-01-15 DIAGNOSIS — Z6833 Body mass index (BMI) 33.0-33.9, adult: Secondary | ICD-10-CM | POA: Diagnosis not present

## 2019-01-15 DIAGNOSIS — Z79899 Other long term (current) drug therapy: Secondary | ICD-10-CM

## 2019-01-15 DIAGNOSIS — Z1211 Encounter for screening for malignant neoplasm of colon: Secondary | ICD-10-CM | POA: Insufficient documentation

## 2019-01-15 DIAGNOSIS — L02212 Cutaneous abscess of back [any part, except buttock]: Secondary | ICD-10-CM | POA: Diagnosis not present

## 2019-01-15 DIAGNOSIS — G9340 Encephalopathy, unspecified: Secondary | ICD-10-CM | POA: Diagnosis not present

## 2019-01-15 LAB — POCT GLYCOSYLATED HEMOGLOBIN (HGB A1C): Hemoglobin A1C: 6.2 % — AB (ref 4.0–5.6)

## 2019-01-15 LAB — GLUCOSE, CAPILLARY: Glucose-Capillary: 108 mg/dL — ABNORMAL HIGH (ref 70–99)

## 2019-01-15 MED ORDER — LANTUS SOLOSTAR 100 UNIT/ML ~~LOC~~ SOPN: 25.0000 [IU] | PEN_INJECTOR | Freq: Every day | SUBCUTANEOUS | 4 refills | Status: DC

## 2019-01-15 MED ORDER — INSULIN LISPRO (1 UNIT DIAL) 100 UNIT/ML (KWIKPEN): 6.0000 [IU] | PEN_INJECTOR | Freq: Three times a day (TID) | SUBCUTANEOUS | 2 refills | Status: AC

## 2019-01-15 MED ORDER — ONETOUCH VERIO VI STRP: ORAL_STRIP | 12 refills | Status: AC

## 2019-01-15 NOTE — Assessment & Plan Note (Signed)
Hypertension: Patient's BP today is 160/75 with a goal of <140/80. The patient endorses adherence to his medication regimen. He denied, chest pain, headache, visual changes, lightheadedness, weakness, dizziness on standing, swelling in the feet or ankles. I feel that a component of his HTN today is due to his inability to maintain consistent exercise due to his fatigue that resulted from the prolonged illness and hospitalization. I feel that with improvement in his exercise routine to around 42min daily of moderate intensity walking that this should improve.   Plan: We will recheck this in one month and make a decision on medication at that time.For now he will try a lower sodium diet and exercise.

## 2019-01-15 NOTE — Progress Notes (Signed)
Educated Drew Evans about professional CGM. Discussed his care with Dr. Berline Lopes. Patient is self motivated and self directed. He was encouraged to continue diabetes self management. Plan to address further at his next visit. Note great improvement in blood sugar and A1C.  I reviewed and approve this note and the plan. Debera Lat, RD 01/15/2019 5:16 PM.

## 2019-01-15 NOTE — Progress Notes (Signed)
   CC: Diabetes and back abscess   HPI:[name] is a 58 y.o. male who presents for evaluation of diabetes and follow-up for his back abscess. Please see individual problem based A/P for details.  Past Medical History:  Diagnosis Date  . Arthritis    lumbar degenerative   . Diabetes mellitus without complication ([place])    Review of Systems:  ROS negative except as per HPI.  Physical Exam: Vitals:   01/15/19 1417  BP: (!) 152/81  Pulse: 80  Temp: 98.4 F (36.9 C)  TempSrc: Oral  SpO2: 99%  Weight: 245 lb 14.4 oz (111.5 kg)  Height: 6' (1.829 m)   General: A/O x4, in no acute distress, afebrile, nondiaphoretic HEENT: PEERL, EMO intact Cardio: RRR, no mrg's  Pulmonary: CTA bilaterally, no wheezing or crackles  MSK: BLE nontender, nonedematous Skin: Wound appears clean, mild serous discharge, no edema or erythema of the boarders  Psych: Appropriate affect, not depressed in appearance, engages well    Assessment & Plan:   See Encounters Tab for problem based charting.  Patient discussed with Dr. Dareen Piano

## 2019-01-15 NOTE — Progress Notes (Signed)
Diabetes Self-Management Education  Visit Type: Drew Evans  Appt. Start Time: 1315 Appt. End Time: 1410  01/15/2019  [name], identified by name and date of birth, is a 58 y.o. male with a diagnosis of Diabetes: Type 2.   ASSESSMENT Drew Evans had an appointment to discuss his diabetes from a referral from Dr. Berline Lopes. Patient discussed his insulin medications, regiment, blood sugar checks, and changes he made to his diet. Also reports support from his aunt to help him with his diabetes. Additionally, gave patient an updated glucose monitor.   Weight 245 lb 14.4 oz (111.5 kg). Body mass index is 33.35 kg/m.  Diabetes Self-Management Education - 01/15/19 1600      Visit Information   Visit Type  First/Initial      Initial Visit   Diabetes Type  Type 2    Are you taking your medications as prescribed?  Yes      Psychosocial Assessment   Self-care barriers  None    Self-management support  Family    Patient Concerns  Nutrition/Meal planning   Wanted confirmation what he was currently doing was right   Special Needs  None    Learning Readiness  Ready    How often do you need to have someone help you when you read instructions, pamphlets, or other written materials from your doctor or pharmacy?  1 - Never      Pre-Education Assessment   Patient understands the diabetes disease and treatment process.  Demonstrates understanding / competency    Patient understands incorporating nutritional management into lifestyle.  Demonstrates understanding / competency    Patient undertands incorporating physical activity into lifestyle.  Demonstrates understanding / competency    Patient understands monitoring blood glucose, interpreting and using results  Demonstrates understanding / competency       Individualized Plan for Diabetes Self-Management Training:   Learning Objective:  Patient will have a greater understanding of diabetes self-management. Patient education  plan is to attend individual and/or group sessions per assessed needs and concerns.   Plan:   Instructed patient to keep doing what he is currently doing. Suggested more fruit and ideas to help blunt hyperglycemic response he has with breakfast cereal.   Expected Outcomes:  Expect good outcomes  Education material provided: Blood glucose readings   If problems or questions, patient to contact team via:  Phone    Future DSME appointment: Same as next doctor appointment

## 2019-01-15 NOTE — Assessment & Plan Note (Signed)
Abscess: See image. Wound appears clean and stable. No signs of infection but with limited granulation tissue. It is filling in but slowly. We will continue to aggressively monitor his blood glucose and encourage a high protein diet.   Plan: Recheck in 4 weeks.  Continue wet to dry dressing per general surgery

## 2019-01-15 NOTE — Assessment & Plan Note (Signed)
DMII: Hgb A1c 12.0 % at the last. Current A1c 6.2%. The patient denied polyuria, polydipsia, headache, fatigue, confusion, nausea, vomiting or diaphoresis. He continues to closely adhere to his current insulin regimen and strict dietary control. He continues to consume a lower carbohydrate diet but has yet to feel physically able to exercise regularly. Today he states that he would prefer to begin an exercise routine which I feel will greatly benefit him.  Average CBG 145 with range from 80's to 180's based on his TID monitoring.  Plan: Continue Lantus 25U QHS Continue Aspart 6U TID WC Consider a GLP1-i again at next visit in one month particularly given the A1c. Repeat A1c at next visit Refer for eye exam at next visit Complete foot exam

## 2019-01-15 NOTE — Patient Instructions (Signed)
FOLLOW-UP INSTRUCTIONS When: 4-6 wks around December 16th For: Routine visit What to bring: All of your medications  I have not made any changes to your medications today. Please continue to take your insulin, monitor your sugar levels and begin exercising with daily walks. Attempt to maintain a regular pace for 79min at least 4-5 times per week as tolerated.   At your next visit we will discuss the addition of a new blood diabetes medicine.   Thank you for your visit to the [facility] today. If you have any questions or concerns please call us at [phone].

## 2019-01-15 NOTE — Assessment & Plan Note (Signed)
Colon cancer screening: Will need to discuss further at his next visit. I feel a [facility] test would be sufficient until he has more fully recovered from his recent major illness.   Plan:  Discuss stool testing at his next visit

## 2019-01-20 NOTE — Progress Notes (Signed)
Internal Medicine Clinic Attending  Case discussed with Dr. Harbrecht at the time of the visit.  We reviewed the resident's history and exam and pertinent patient test results.  I agree with the assessment, diagnosis, and plan of care documented in the resident's note.   

## 2019-01-21 DIAGNOSIS — L02212 Cutaneous abscess of back [any part, except buttock]: Secondary | ICD-10-CM | POA: Diagnosis not present

## 2019-01-21 DIAGNOSIS — E119 Type 2 diabetes mellitus without complications: Secondary | ICD-10-CM | POA: Diagnosis not present

## 2019-01-21 DIAGNOSIS — Z792 Long term (current) use of antibiotics: Secondary | ICD-10-CM | POA: Diagnosis not present

## 2019-01-21 DIAGNOSIS — G9340 Encephalopathy, unspecified: Secondary | ICD-10-CM | POA: Diagnosis not present

## 2019-01-21 DIAGNOSIS — I10 Essential (primary) hypertension: Secondary | ICD-10-CM | POA: Diagnosis not present

## 2019-01-24 DIAGNOSIS — L02212 Cutaneous abscess of back [any part, except buttock]: Secondary | ICD-10-CM | POA: Diagnosis not present

## 2019-01-28 DIAGNOSIS — L02212 Cutaneous abscess of back [any part, except buttock]: Secondary | ICD-10-CM | POA: Diagnosis not present

## 2019-01-28 DIAGNOSIS — Z792 Long term (current) use of antibiotics: Secondary | ICD-10-CM | POA: Diagnosis not present

## 2019-01-28 DIAGNOSIS — G9340 Encephalopathy, unspecified: Secondary | ICD-10-CM | POA: Diagnosis not present

## 2019-01-28 DIAGNOSIS — E119 Type 2 diabetes mellitus without complications: Secondary | ICD-10-CM | POA: Diagnosis not present

## 2019-01-28 DIAGNOSIS — I10 Essential (primary) hypertension: Secondary | ICD-10-CM | POA: Diagnosis not present

## 2019-02-04 DIAGNOSIS — G9340 Encephalopathy, unspecified: Secondary | ICD-10-CM | POA: Diagnosis not present

## 2019-02-04 DIAGNOSIS — L02212 Cutaneous abscess of back [any part, except buttock]: Secondary | ICD-10-CM | POA: Diagnosis not present

## 2019-02-04 DIAGNOSIS — Z792 Long term (current) use of antibiotics: Secondary | ICD-10-CM | POA: Diagnosis not present

## 2019-02-04 DIAGNOSIS — I10 Essential (primary) hypertension: Secondary | ICD-10-CM | POA: Diagnosis not present

## 2019-02-04 DIAGNOSIS — E119 Type 2 diabetes mellitus without complications: Secondary | ICD-10-CM | POA: Diagnosis not present

## 2019-02-11 DIAGNOSIS — Z792 Long term (current) use of antibiotics: Secondary | ICD-10-CM | POA: Diagnosis not present

## 2019-02-11 DIAGNOSIS — E119 Type 2 diabetes mellitus without complications: Secondary | ICD-10-CM | POA: Diagnosis not present

## 2019-02-11 DIAGNOSIS — I10 Essential (primary) hypertension: Secondary | ICD-10-CM | POA: Diagnosis not present

## 2019-02-11 DIAGNOSIS — G9340 Encephalopathy, unspecified: Secondary | ICD-10-CM | POA: Diagnosis not present

## 2019-02-11 DIAGNOSIS — L02212 Cutaneous abscess of back [any part, except buttock]: Secondary | ICD-10-CM | POA: Diagnosis not present

## 2019-02-18 DIAGNOSIS — Z792 Long term (current) use of antibiotics: Secondary | ICD-10-CM | POA: Diagnosis not present

## 2019-02-18 DIAGNOSIS — L02212 Cutaneous abscess of back [any part, except buttock]: Secondary | ICD-10-CM | POA: Diagnosis not present

## 2019-02-18 DIAGNOSIS — G9340 Encephalopathy, unspecified: Secondary | ICD-10-CM | POA: Diagnosis not present

## 2019-02-18 DIAGNOSIS — E119 Type 2 diabetes mellitus without complications: Secondary | ICD-10-CM | POA: Diagnosis not present

## 2019-02-18 DIAGNOSIS — I10 Essential (primary) hypertension: Secondary | ICD-10-CM | POA: Diagnosis not present

## 2019-02-19 ENCOUNTER — Ambulatory Visit: Payer: Medicare Other

## 2019-02-25 DIAGNOSIS — L02212 Cutaneous abscess of back [any part, except buttock]: Secondary | ICD-10-CM | POA: Diagnosis not present

## 2019-02-25 DIAGNOSIS — E119 Type 2 diabetes mellitus without complications: Secondary | ICD-10-CM | POA: Diagnosis not present

## 2019-02-25 DIAGNOSIS — Z792 Long term (current) use of antibiotics: Secondary | ICD-10-CM | POA: Diagnosis not present

## 2019-02-25 DIAGNOSIS — G9340 Encephalopathy, unspecified: Secondary | ICD-10-CM | POA: Diagnosis not present

## 2019-02-25 DIAGNOSIS — I10 Essential (primary) hypertension: Secondary | ICD-10-CM | POA: Diagnosis not present

## 2019-03-04 DIAGNOSIS — L02212 Cutaneous abscess of back [any part, except buttock]: Secondary | ICD-10-CM | POA: Diagnosis not present

## 2019-03-04 DIAGNOSIS — I10 Essential (primary) hypertension: Secondary | ICD-10-CM | POA: Diagnosis not present

## 2019-03-04 DIAGNOSIS — G9340 Encephalopathy, unspecified: Secondary | ICD-10-CM | POA: Diagnosis not present

## 2019-03-04 DIAGNOSIS — E119 Type 2 diabetes mellitus without complications: Secondary | ICD-10-CM | POA: Diagnosis not present

## 2019-03-04 DIAGNOSIS — Z792 Long term (current) use of antibiotics: Secondary | ICD-10-CM | POA: Diagnosis not present

## 2019-03-11 DIAGNOSIS — Z5189 Encounter for other specified aftercare: Secondary | ICD-10-CM | POA: Diagnosis not present

## 2019-03-11 DIAGNOSIS — Z792 Long term (current) use of antibiotics: Secondary | ICD-10-CM | POA: Diagnosis not present

## 2019-03-11 DIAGNOSIS — L02212 Cutaneous abscess of back [any part, except buttock]: Secondary | ICD-10-CM | POA: Diagnosis not present

## 2019-03-11 DIAGNOSIS — E119 Type 2 diabetes mellitus without complications: Secondary | ICD-10-CM | POA: Diagnosis not present

## 2019-03-11 DIAGNOSIS — I10 Essential (primary) hypertension: Secondary | ICD-10-CM | POA: Diagnosis not present

## 2019-03-11 DIAGNOSIS — G9340 Encephalopathy, unspecified: Secondary | ICD-10-CM | POA: Diagnosis not present

## 2019-03-18 DIAGNOSIS — G9340 Encephalopathy, unspecified: Secondary | ICD-10-CM | POA: Diagnosis not present

## 2019-03-18 DIAGNOSIS — L02212 Cutaneous abscess of back [any part, except buttock]: Secondary | ICD-10-CM | POA: Diagnosis not present

## 2019-03-18 DIAGNOSIS — I10 Essential (primary) hypertension: Secondary | ICD-10-CM | POA: Diagnosis not present

## 2019-03-18 DIAGNOSIS — E119 Type 2 diabetes mellitus without complications: Secondary | ICD-10-CM | POA: Diagnosis not present

## 2019-03-18 DIAGNOSIS — Z792 Long term (current) use of antibiotics: Secondary | ICD-10-CM | POA: Diagnosis not present

## 2019-04-08 NOTE — Progress Notes (Addendum)
   CC: DMII and HTn  HPI:[name] is a 59 y.o. male who presents for evaluation of DMII and HTN as well as a back abscess. Please see individual problem based A/P for details.  Depression, PHQ-9: Based on the patients    Office Visit from 04/09/2019 in [facility]  PHQ-9 Total Score  0     score we have decided to continue to monitor.  Past Medical History:  Diagnosis Date  . Arthritis    lumbar degenerative   . Diabetes mellitus without complication (HCC)    Review of Systems:  ROS negative except as per HPI.  Physical Exam: Vitals:   04/09/19 1558  BP: (!) 155/90  Pulse: 84  Temp: 98.4 F (36.9 C)  TempSrc: Skin  SpO2: 99%  Weight: 251 lb 1.6 oz (113.9 kg)   General: A/O x4, in no acute distress, afebrile, nondiaphoretic HEENT: PEERL, EMO intact Cardio: RRR, no mrg's  Pulmonary: CTA bilaterally, no wheezing or crackles  Abdomen: Bowel sounds normal, soft, nontender  MSK: BLE nontender, nonedematous  Neuro: Alert, conversational,  normal gait Psych: Appropriate affect, not depressed in appearance, engages well  Assessment & Plan:   See Encounters Tab for problem based charting.  Patient discussed with Dr. Cleda Daub

## 2019-04-09 ENCOUNTER — Other Ambulatory Visit: Payer: Self-pay

## 2019-04-09 ENCOUNTER — Encounter: Payer: Self-pay | Admitting: Internal Medicine

## 2019-04-09 ENCOUNTER — Ambulatory Visit (INDEPENDENT_AMBULATORY_CARE_PROVIDER_SITE_OTHER): Payer: Medicare Other | Admitting: Internal Medicine

## 2019-04-09 VITALS — BP 155/90 | HR 84 | Temp 98.4°F | Wt 251.1 lb

## 2019-04-09 DIAGNOSIS — L02212 Cutaneous abscess of back [any part, except buttock]: Secondary | ICD-10-CM

## 2019-04-09 DIAGNOSIS — I10 Essential (primary) hypertension: Secondary | ICD-10-CM | POA: Diagnosis not present

## 2019-04-09 DIAGNOSIS — Z79899 Other long term (current) drug therapy: Secondary | ICD-10-CM

## 2019-04-09 DIAGNOSIS — Z1211 Encounter for screening for malignant neoplasm of colon: Secondary | ICD-10-CM

## 2019-04-09 DIAGNOSIS — E119 Type 2 diabetes mellitus without complications: Secondary | ICD-10-CM

## 2019-04-09 DIAGNOSIS — Z794 Long term (current) use of insulin: Secondary | ICD-10-CM

## 2019-04-09 LAB — POCT GLYCOSYLATED HEMOGLOBIN (HGB A1C): Hemoglobin A1C: 6.8 % — AB (ref 4.0–5.6)

## 2019-04-09 LAB — GLUCOSE, CAPILLARY: Glucose-Capillary: 112 mg/dL — ABNORMAL HIGH (ref 70–99)

## 2019-04-09 NOTE — Patient Instructions (Signed)
FOLLOW-UP INSTRUCTIONS When: 3-4 months For: Routine Visit What to bring: All of your medications  I have not made any changes to your medications today. Please continue to consider Victoza or Trulicity as alternative treatments to insulin.  Please purchase a blood pressure machine and check your pressure at home.  Please continue your insulin, low carb eating and higher vegetable diets.   Thank you for your visit to the [facility] today. If you have any questions or concerns please call us at [phone].

## 2019-04-10 ENCOUNTER — Encounter: Payer: Self-pay | Admitting: Internal Medicine

## 2019-04-10 LAB — BMP8+ANION GAP
Anion Gap: 12 mmol/L (ref 10.0–18.0)
BUN/Creatinine Ratio: 20 (ref 10–24)
BUN: 18 mg/dL (ref 8–27)
CO2: 25 mmol/L (ref 20–29)
Calcium: 9.7 mg/dL (ref 8.6–10.2)
Chloride: 102 mmol/L (ref 96–106)
Creatinine, Ser: 0.88 mg/dL (ref 0.76–1.27)
GFR calc Af Amer: 108 mL/min/{1.73_m2} (ref >59)
GFR calc non Af Amer: 93 mL/min/{1.73_m2} (ref >59)
Glucose: 103 mg/dL — ABNORMAL HIGH (ref 65–99)
Potassium: 4.7 mmol/L (ref 3.5–5.2)
Sodium: 139 mmol/L (ref 134–144)

## 2019-04-10 NOTE — Assessment & Plan Note (Signed)
DMII: Hgb A1c 6.2 % at the last visit. Current A1c 6.8% which is only a modest increase and somewhat expected given that these occurred over the holidays. The patient denied polyuria, polydipsia, headache, fatigue, confusion, nausea, vomiting or diaphoresis.   Plan: Continue Lantus 25U QHS Continue Aspart 6U TID WC Consider a GLP1-i again at next visit in one month particularly given the A1c. Refer for eye exam at next visit Complete foot exam Repeat A1c at next visit

## 2019-04-10 NOTE — Assessment & Plan Note (Addendum)
Hypertension: Patient's BP today is 155/90 with a goal of <140/80. Drew Evans stated that his home health nurse had been obtaining readings in the 130's systolic at home consistently. Also, he would like to avoid medical treatment as much as possible. I agree that avoiding adding additional medications is not optimal but explained the mortality benefit of maintaining a goal BP. We have agreed that he will exercise, decrease the sodium content of his diet and consider treatment if this is insufficient. Additionally I have asked him to obtain a home blood pressure device and to keep a log of these measurements so that we may better monitor this for white coat hypertension. He denied, chest pain, headache, visual changes, lightheadedness, weakness, dizziness on standing, swelling in the feet or ankles.   Plan: Home BP monitoring Low sodium diet Exercise Consider ARB at next visit if his BP had not improved. BMP unremarkable BMP Latest Ref Rng & Units 04/09/2019 11/24/2018 11/23/2018  Glucose 65 - 99 mg/dL 174(B) 449(Q) 759(F)  BUN 8 - 27 mg/dL 18 7 6   Creatinine 0.76 - 1.27 mg/dL 6.38 4.66  BUN/Creat Ratio 10 - 24 20 - -  Sodium 134 - 144 mmol/L 139 138 139  Potassium 3.5 - 5.2 mmol/L 4.7 3.9 3.8  Chloride 96 - 106 mmol/L 102 104 100  CO2 20 - 29 mmol/L 25 26 28   Calcium 8.6 - 10.2 mg/dL 9.7 5.99) )

## 2019-04-10 NOTE — Assessment & Plan Note (Signed)
Abscess: Was informed by General surgery that he graduated from wet/dry bandaging and is in good to dress as usual. The wound appeared completely covered with skin, no purulent drainage, pain or erythema.

## 2019-04-10 NOTE — Assessment & Plan Note (Signed)
Colon Cancer Screening: Discuss in depth at next visit. Would prefer Colonoscopy screening but I am uncertain this is feasible.

## 2019-04-13 NOTE — Progress Notes (Signed)
Internal Medicine Clinic Attending  Case discussed with Dr. Harbrecht at the time of the visit.  We reviewed the resident's history and exam and pertinent patient test results.  I agree with the assessment, diagnosis, and plan of care documented in the resident's note.   

## 2019-07-07 ENCOUNTER — Encounter: Payer: Self-pay | Admitting: *Deleted

## 2019-08-11 ENCOUNTER — Other Ambulatory Visit: Payer: Self-pay | Admitting: Internal Medicine

## 2019-08-11 DIAGNOSIS — E119 Type 2 diabetes mellitus without complications: Secondary | ICD-10-CM

## 2019-08-11 NOTE — Telephone Encounter (Signed)
Last visit 04/09/2019  Refill request not reviewed by CMA.  Please review request for accuracy prior to filling and approve/deny rx as appropriate. Please advise if patient needs an appt to be seen. Criss Alvine, Zarra Geffert Cassady6/7/20214:01 PM

## 2019-09-26 ENCOUNTER — Encounter: Payer: Medicare Other | Admitting: Internal Medicine

## 2020-09-07 ENCOUNTER — Encounter: Payer: Self-pay | Admitting: *Deleted

## 2020-10-19 ENCOUNTER — Encounter: Payer: Medicare Other | Admitting: Internal Medicine

## 2021-06-29 IMAGING — DX DG ABDOMEN 1V
1 series · 1 of 1 positions shown · non-contrast
Comparison: None.

CLINICAL DATA: Abdominal pain

EXAM:
ABDOMEN - 1 VIEW

[abdomen kub]
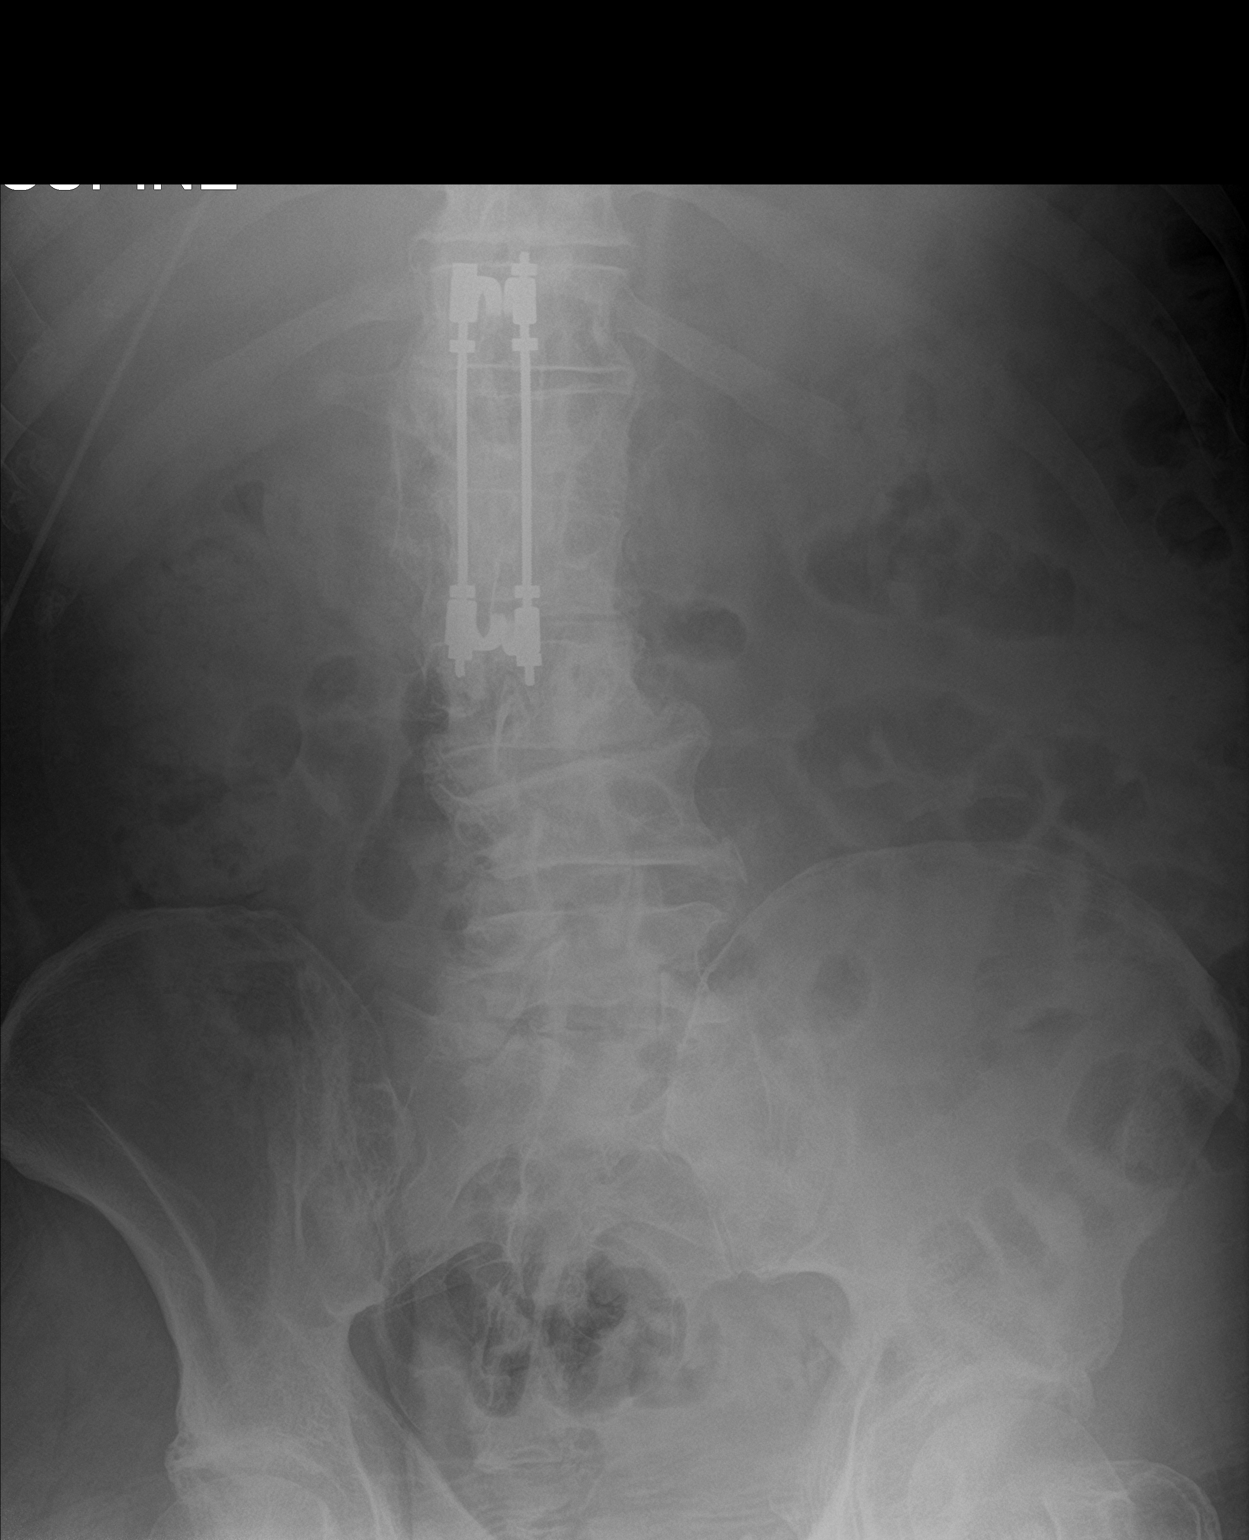

[1 of 1 positions shown; findings below may reference images not displayed]

FINDINGS: Scattered large and small bowel gas is noted. No obstructive changes
are seen. No free air is noted. Fixation rods are noted in the
thoracolumbar junction. Degenerative changes of the lumbar spine as
well as prior L4 compression deformity are seen.
IMPRESSION: No acute abnormality noted.

## 2021-09-01 DIAGNOSIS — I469 Cardiac arrest, cause unspecified: Secondary | ICD-10-CM | POA: Diagnosis not present

## 2021-09-01 DIAGNOSIS — Z743 Need for continuous supervision: Secondary | ICD-10-CM | POA: Diagnosis not present

## 2021-09-03 DIAGNOSIS — 419620001 Death: Secondary | SNOMED CT | POA: Diagnosis not present

## 2021-09-03 DEATH — deceased

## 2021-09-21 ENCOUNTER — Ambulatory Visit: Payer: Medicare Other | Admitting: Dermatology

## 2021-11-30 ENCOUNTER — Encounter: Payer: Self-pay | Admitting: *Deleted

## 2021-11-30 NOTE — Progress Notes (Signed)
[facility] Quality Team Note  Name: Drew Evans. Date of Birth: 04-Aug-1960 MRN: [id number] Date: 11/30/2021  [facility] Quality Team has reviewed this patient's chart, please see recommendations below:  AWV;  Colorectal Screening;  Diabetic Retinal Eye Exam;  [facility] Quality Other; Pt has open gaps for colonoscopy, diabetic eye exam, A1C, and AWV.  Tried to call pt, Number not in service.
